# Patient Record
Sex: Male | Born: 1960 | Race: White | Hispanic: No | Marital: Married | Smoking: Never smoker
Health system: Southern US, Community
[De-identification: ages and names within clinical notes are randomized; demographics above are authoritative.]

## PROBLEM LIST (undated history)

## (undated) DIAGNOSIS — E119 Type 2 diabetes mellitus without complications: Secondary | ICD-10-CM

## (undated) DIAGNOSIS — I10 Essential (primary) hypertension: Secondary | ICD-10-CM

## (undated) HISTORY — PX: LEG SURGERY: SHX1003

## (undated) HISTORY — PX: HAND SURGERY: SHX662

## (undated) HISTORY — PX: CERVICAL SPINE SURGERY: SHX589

---

## 2013-05-12 ENCOUNTER — Emergency Department (HOSPITAL_COMMUNITY): Payer: Medicare Other

## 2013-05-12 ENCOUNTER — Encounter (HOSPITAL_COMMUNITY): Payer: Self-pay | Admitting: Emergency Medicine

## 2013-05-12 ENCOUNTER — Inpatient Hospital Stay (HOSPITAL_COMMUNITY)
Admission: EM | Admit: 2013-05-12 | Discharge: 2013-05-16 | DRG: 391 | Disposition: A | Discharge status: Home / Self Care | Payer: Medicare Other | Attending: Neurosurgery | Admitting: Neurosurgery

## 2013-05-12 ENCOUNTER — Other Ambulatory Visit: Payer: Self-pay

## 2013-05-12 DIAGNOSIS — J189 Pneumonia, unspecified organism: Secondary | ICD-10-CM

## 2013-05-12 DIAGNOSIS — G8918 Other acute postprocedural pain: Secondary | ICD-10-CM

## 2013-05-12 DIAGNOSIS — R1313 Dysphagia, pharyngeal phase: Principal | ICD-10-CM | POA: Diagnosis present

## 2013-05-12 DIAGNOSIS — R1310 Dysphagia, unspecified: Secondary | ICD-10-CM

## 2013-05-12 DIAGNOSIS — Z6830 Body mass index (BMI) 30.0-30.9, adult: Secondary | ICD-10-CM

## 2013-05-12 DIAGNOSIS — E119 Type 2 diabetes mellitus without complications: Secondary | ICD-10-CM | POA: Diagnosis present

## 2013-05-12 DIAGNOSIS — I10 Essential (primary) hypertension: Secondary | ICD-10-CM

## 2013-05-12 DIAGNOSIS — A419 Sepsis, unspecified organism: Secondary | ICD-10-CM

## 2013-05-12 DIAGNOSIS — Z981 Arthrodesis status: Secondary | ICD-10-CM

## 2013-05-12 DIAGNOSIS — G4730 Sleep apnea, unspecified: Secondary | ICD-10-CM | POA: Diagnosis present

## 2013-05-12 DIAGNOSIS — E43 Unspecified severe protein-calorie malnutrition: Secondary | ICD-10-CM | POA: Diagnosis present

## 2013-05-12 HISTORY — DX: Type 2 diabetes mellitus without complications: E11.9

## 2013-05-12 HISTORY — DX: Essential (primary) hypertension: I10

## 2013-05-12 LAB — CBC WITH DIFFERENTIAL/PLATELET
Basophils Absolute: 0 10*3/uL (ref 0.0–0.1)
Basophils Relative: 0 % (ref 0–1)
EOS ABS: 0 10*3/uL (ref 0.0–0.7)
EOS PCT: 0 % (ref 0–5)
HCT: 40.9 % (ref 39.0–52.0)
Hemoglobin: 14.3 g/dL (ref 13.0–17.0)
LYMPHS PCT: 15 % (ref 12–46)
Lymphs Abs: 2.7 10*3/uL (ref 0.7–4.0)
MCH: 32.6 pg (ref 26.0–34.0)
MCHC: 35 g/dL (ref 30.0–36.0)
MCV: 93.2 fL (ref 78.0–100.0)
MONO ABS: 1.3 10*3/uL — AB (ref 0.1–1.0)
MONOS PCT: 7 % (ref 3–12)
Neutro Abs: 13.5 10*3/uL — ABNORMAL HIGH (ref 1.7–7.7)
Neutrophils Relative %: 77 % (ref 43–77)
PLATELETS: 241 10*3/uL (ref 150–400)
RBC: 4.39 MIL/uL (ref 4.22–5.81)
RDW: 12.1 % (ref 11.5–15.5)
WBC: 17.5 10*3/uL — ABNORMAL HIGH (ref 4.0–10.5)

## 2013-05-12 LAB — I-STAT CHEM 8, ED
BUN: 14 mg/dL (ref 6–23)
CREATININE: 0.8 mg/dL (ref 0.50–1.35)
Calcium, Ion: 1.06 mmol/L — ABNORMAL LOW (ref 1.12–1.23)
Chloride: 99 mEq/L (ref 96–112)
Glucose, Bld: 188 mg/dL — ABNORMAL HIGH (ref 70–99)
HEMATOCRIT: 40 % (ref 39.0–52.0)
Hemoglobin: 13.6 g/dL (ref 13.0–17.0)
Potassium: 3.9 mEq/L (ref 3.7–5.3)
Sodium: 137 mEq/L (ref 137–147)
TCO2: 24 mmol/L (ref 0–100)

## 2013-05-12 LAB — COMPREHENSIVE METABOLIC PANEL
ALT: 22 U/L (ref 0–53)
AST: 15 U/L (ref 0–37)
Albumin: 3.4 g/dL — ABNORMAL LOW (ref 3.5–5.2)
Alkaline Phosphatase: 91 U/L (ref 39–117)
BUN: 15 mg/dL (ref 6–23)
CO2: 24 meq/L (ref 19–32)
CREATININE: 0.66 mg/dL (ref 0.50–1.35)
Calcium: 9 mg/dL (ref 8.4–10.5)
Chloride: 97 mEq/L (ref 96–112)
GLUCOSE: 192 mg/dL — AB (ref 70–99)
Potassium: 4.3 mEq/L (ref 3.7–5.3)
SODIUM: 137 meq/L (ref 137–147)
TOTAL PROTEIN: 7.6 g/dL (ref 6.0–8.3)
Total Bilirubin: 0.8 mg/dL (ref 0.3–1.2)

## 2013-05-12 LAB — I-STAT TROPONIN, ED: Troponin i, poc: 0 ng/mL (ref 0.00–0.08)

## 2013-05-12 LAB — I-STAT CG4 LACTIC ACID, ED
LACTIC ACID, VENOUS: 1.37 mmol/L (ref 0.5–2.2)
Lactic Acid, Venous: 1.56 mmol/L (ref 0.5–2.2)

## 2013-05-12 MED ORDER — SODIUM CHLORIDE 0.9 % IV SOLN
1000.0000 mL | INTRAVENOUS | Status: DC
  Administered 2013-05-12: 1000 mL via INTRAVENOUS

## 2013-05-12 MED ORDER — ONDANSETRON HCL 4 MG/2ML IJ SOLN
4.0000 mg | Freq: Once | INTRAMUSCULAR | Status: AC
  Administered 2013-05-12: 4 mg via INTRAVENOUS
  Filled 2013-05-12: qty 2

## 2013-05-12 MED ORDER — SODIUM CHLORIDE 0.9 % IV SOLN
1000.0000 mL | Freq: Once | INTRAVENOUS | Status: AC
  Administered 2013-05-12: 1000 mL via INTRAVENOUS

## 2013-05-12 MED ORDER — HYDROMORPHONE HCL PF 1 MG/ML IJ SOLN
0.5000 mg | Freq: Once | INTRAMUSCULAR | Status: AC
  Administered 2013-05-12: 0.5 mg via INTRAVENOUS
  Filled 2013-05-12: qty 1

## 2013-05-12 MED ORDER — ACETAMINOPHEN 650 MG RE SUPP
650.0000 mg | RECTAL | Status: DC | PRN
  Administered 2013-05-12: 650 mg via RECTAL
  Filled 2013-05-12: qty 1

## 2013-05-12 MED ORDER — ACETAMINOPHEN 160 MG/5ML PO SOLN
650.0000 mg | Freq: Once | ORAL | Status: DC
  Filled 2013-05-12: qty 20.3

## 2013-05-12 MED ORDER — PANTOPRAZOLE SODIUM 40 MG IV SOLR
40.0000 mg | Freq: Two times a day (BID) | INTRAVENOUS | Status: DC
  Administered 2013-05-13: 40 mg via INTRAVENOUS
  Filled 2013-05-12 (×3): qty 40

## 2013-05-12 MED ORDER — PIPERACILLIN-TAZOBACTAM 3.375 G IVPB 30 MIN
3.3750 g | Freq: Once | INTRAVENOUS | Status: AC
  Administered 2013-05-12: 3.375 g via INTRAVENOUS
  Filled 2013-05-12: qty 50

## 2013-05-12 MED ORDER — MORPHINE SULFATE 4 MG/ML IJ SOLN
4.0000 mg | Freq: Once | INTRAMUSCULAR | Status: DC
  Filled 2013-05-12: qty 1

## 2013-05-12 MED ORDER — VANCOMYCIN HCL IN DEXTROSE 1-5 GM/200ML-% IV SOLN
1000.0000 mg | Freq: Once | INTRAVENOUS | Status: AC
  Administered 2013-05-13: 1000 mg via INTRAVENOUS
  Filled 2013-05-12: qty 200

## 2013-05-12 MED ORDER — DEXAMETHASONE SODIUM PHOSPHATE 10 MG/ML IJ SOLN
10.0000 mg | Freq: Four times a day (QID) | INTRAMUSCULAR | Status: DC
  Administered 2013-05-13 – 2013-05-16 (×14): 10 mg via INTRAVENOUS
  Filled 2013-05-12 (×22): qty 1

## 2013-05-12 MED ORDER — IOHEXOL 300 MG/ML  SOLN
75.0000 mL | Freq: Once | INTRAMUSCULAR | Status: AC | PRN
  Administered 2013-05-13: 75 mL via INTRAVENOUS

## 2013-05-12 NOTE — ED Provider Notes (Signed)
CSN: [id number]     Arrival date & time 05/12/13  2156 History   First MD Initiated Contact with Patient 05/12/13 2225     Chief Complaint  Patient presents with  . Fever  . Dehydration  . Dizziness  . Nausea  . Emesis     (Consider location/radiation/quality/duration/timing/severity/associated sxs/prior Treatment) HPI Comments: 53 year old male, history of recent anterior approach cervical spine surgery for a ruptured disc who presents with a fever and sore throat with difficulty swallowing. This was gradual in onset, persistent, gradually worsening and has now become severe. He has associated nausea, he is unable to swallow even liquids. This is rapidly progressive. He is not on antibiotics. The patient is known to be a diabetic and had some hypertension.  Spell states that he has had some altered mental status today. He has been very dizzy, he has had some visual complications getting lightheaded when he stands up, he has had some confusion as well.  Patient is a 53 y.o. male presenting with fever, dizziness, and vomiting. The history is provided by the patient.  Fever Associated symptoms: vomiting   Dizziness Associated symptoms: vomiting   Emesis   Past Medical History  Diagnosis Date  . Diabetes mellitus without complication   . Hypertension    Past Surgical History  Procedure Laterality Date  . Cervical spine surgery    . Hand surgery     No family history on file. History  Substance Use Topics  . Smoking status: Never Smoker   . Smokeless tobacco: Not on file  . Alcohol Use: Yes    Review of Systems  Constitutional: Positive for fever.  Gastrointestinal: Positive for vomiting.  Neurological: Positive for dizziness.  All other systems reviewed and are negative.      Allergies  Sulfa antibiotics  Home Medications   Current Outpatient Rx  Name  Route  Sig  Dispense  Refill  . aspirin EC 81 MG tablet   Oral   Take 81 mg by mouth daily.         Marland Kitchen.  atorvastatin (LIPITOR) 20 MG tablet   Oral   Take 20 mg by mouth daily.         . diazepam (VALIUM) 5 MG tablet   Oral   Take 5 mg by mouth every 6 (six) hours as needed for anxiety or muscle spasms.          Marland Kitchen. HYDROcodone-acetaminophen (NORCO/VICODIN) 5-325 MG per tablet   Oral   Take 1 tablet by mouth every 6 (six) hours as needed for moderate pain.         Marland Kitchen. insulin glargine (LANTUS) 100 UNIT/ML injection   Subcutaneous   Inject 70 Units into the skin every morning.         . insulin lispro (HUMALOG) 100 UNIT/ML injection   Subcutaneous   Inject 18 Units into the skin 3 (three) times daily before meals.         Marland Kitchen. lisinopril (PRINIVIL,ZESTRIL) 5 MG tablet   Oral   Take 5 mg by mouth daily.         . Multiple Vitamins-Minerals (MULTIVITAMIN PO)   Oral   Take 1 tablet by mouth daily.         Marland Kitchen. oxyCODONE (ROXICODONE) 15 MG immediate release tablet   Oral   Take 15 mg by mouth every 4 (four) hours as needed for pain.         . pregabalin (LYRICA) 75 MG capsule  Oral   Take 75 mg by mouth daily.          BP 115/62  Pulse 111  Temp(Src) 102.1 F (38.9 C) (Oral)  Resp 26  Ht 6' (1.829 m)  Wt 226 lb (102.513 kg)  BMI 30.64 kg/m2  SpO2 91% Physical Exam  Nursing note and vitals reviewed. Constitutional: He appears well-developed and well-nourished. He appears distressed.  HENT:  Head: Normocephalic and atraumatic.  Mouth/Throat: No oropharyngeal exudate.  Purulent exudative material on the bilateral tonsils and posterior soft palate. Oropharynx is patent, phonation is slightly hoarse but there is no difficulty in no distress with breathing  Eyes: Conjunctivae and EOM are normal. Pupils are equal, round, and reactive to light. Right eye exhibits no discharge. Left eye exhibits no discharge. No scleral icterus.  Neck:  Significant swelling and erythema around the surgical site, the left side of the anterior neck is swollen and tender.  Cardiovascular:  Regular rhythm, normal heart sounds and intact distal pulses.  Exam reveals no gallop and no friction rub.   No murmur heard. Tachycardia present  Pulmonary/Chest: Effort normal and breath sounds normal. No respiratory distress. He has no wheezes. He has no rales.  Abdominal: Soft. Bowel sounds are normal. He exhibits no distension and no mass. There is no tenderness.  Musculoskeletal: Normal range of motion. He exhibits no edema and no tenderness.  Neurological: He is alert. Coordination normal.  Answers questions appropriately, follows all commands, normal strength of all 4 extremities. Cranial nerves III through XII intact  Skin: Skin is warm and dry. No rash noted. No erythema.  Psychiatric: He has a normal mood and affect. His behavior is normal.    ED Course  Procedures (including critical care time) Labs Review Labs Reviewed  CBC WITH DIFFERENTIAL - Abnormal; Notable for the following:    WBC 17.5 (*)    Neutro Abs 13.5 (*)    Monocytes Absolute 1.3 (*)    All other components within normal limits  COMPREHENSIVE METABOLIC PANEL - Abnormal; Notable for the following:    Glucose, Bld 192 (*)    Albumin 3.4 (*)    All other components within normal limits  I-STAT CHEM 8, ED - Abnormal; Notable for the following:    Glucose, Bld 188 (*)    Calcium, Ion 1.06 (*)    All other components within normal limits  CULTURE, BLOOD (ROUTINE X 2)  CULTURE, BLOOD (ROUTINE X 2)  I-STAT CG4 LACTIC ACID, ED  I-STAT TROPOININ, ED  I-STAT CG4 LACTIC ACID, ED   Imaging Review Dg Chest Port 1 View  05/12/2013   CLINICAL DATA:  Shortness of breath.  EXAM: PORTABLE CHEST - 1 VIEW  COMPARISON:  None.  FINDINGS: Mild cardiomegaly is noted. Status post surgical fusion of lower cervical spine. Bilateral basilar opacities are noted concerning for pneumonia or subsegmental atelectasis. No pleural effusion or pneumothorax is noted.  IMPRESSION: Mild bilateral basilar opacities concerning for pneumonia or  subsegmental atelectasis.   Electronically Signed   By: Roque Lias M.D.   On: 05/12/2013 23:56      MDM   Final diagnoses:  Sepsis  HCAP (healthcare-associated pneumonia)  Post-operative pain    The patient appears acutely ill and is likely septic from a postoperative infection of the neck. I am somewhat concerned that this infection may spread to the mediastinum down into the chest, he will need imaging of the cervical spine, soft tissues of the neck and CT scan of the chest  with contrast to evaluate for spreading infection. Broad spectrum antibiotics after blood cultures, anticipate admission to the hospital. His airway at this time is open but will keep a close eye as he may need definitive airway management should his breathing become more labored or any upper airway obstruction.  D/w Dr. Wynetta Emery at 11:15 - aware of pt - will d/w critical care  Discussed with neurosurgeon who has written admission orders. The patient has a fever, tachycardia, leukocytosis and has mild hypoxia with evidence of pneumonia on x-ray which is mild but present. CT scans of the neck show no signs of major infection, hematoma, abscess or tracheal compromise. Because of the risk of airway management need he will go to the intensive. Tonight. The patient is septic from infection, here does appear critically ill but improving.  CRITICAL CARE Performed by: Vida Roller Total critical care time: 35 Critical care time was exclusive of separately billable procedures and treating other patients. Critical care was necessary to treat or prevent imminent or life-threatening deterioration. Critical care was time spent personally by me on the following activities: development of treatment plan with patient and/or surrogate as well as nursing, discussions with consultants, evaluation of patient's response to treatment, examination of patient, obtaining history from patient or surrogate, ordering and performing treatments and  interventions, ordering and review of laboratory studies, ordering and review of radiographic studies, pulse oximetry and re-evaluation of patient's condition.   Vida Roller, MD 05/13/13 [phone]

## 2013-05-12 NOTE — ED Notes (Signed)
Family at bedside. 

## 2013-05-12 NOTE — Consult Note (Signed)
Reason for Consult: Neck pain and swallowing difficulty Referring Physician: Emergency department  Gerald Ross is an 53 y.o. male.  HPI: Patient is a 53 year old gentleman who is now 2 days status post anterior cervical discectomy and fusion performed by Dr. Joya Salm has been experiencing increasing levels of dysphagia at home as well as poor appetite poor by mouth intake. Patient denies any arm pain or numbness and tingling in his hands or his legs he denies any difficulty breathing just reports a lot of difficulty swallowing feels like things are catching in his throat and increased sputum production. They have noticed the some swelling around the neck but they said it was a lot worse yesterday than it is today.  Past Medical History  Diagnosis Date  . Diabetes mellitus without complication   . Hypertension     Past Surgical History  Procedure Laterality Date  . Cervical spine surgery    . Hand surgery      No family history on file.  Social History:  reports that he has never smoked. He does not have any smokeless tobacco history on file. He reports that he drinks alcohol. He reports that he does not use illicit drugs.  Allergies:  Allergies  Allergen Reactions  . Sulfa Antibiotics Nausea And Vomiting    Medications: I have reviewed the patient's current medications.  Results for orders placed during the hospital encounter of 05/12/13 (from the past 48 hour(s))  CBC WITH DIFFERENTIAL     Status: Abnormal   Collection Time    05/12/13 10:20 PM      Result Value Ref Range   WBC 17.5 (*) 4.0 - 10.5 K/uL   RBC 4.39  4.22 - 5.81 MIL/uL   Hemoglobin 14.3  13.0 - 17.0 g/dL   HCT 40.9  39.0 - 52.0 %   MCV 93.2  78.0 - 100.0 fL   MCH 32.6  26.0 - 34.0 pg   MCHC 35.0  30.0 - 36.0 g/dL   RDW 12.1  11.5 - 15.5 %   Platelets 241  150 - 400 K/uL   Neutrophils Relative % 77  43 - 77 %   Neutro Abs 13.5 (*) 1.7 - 7.7 K/uL   Lymphocytes Relative 15  12 - 46 %   Lymphs Abs 2.7  0.7 -  4.0 K/uL   Monocytes Relative 7  3 - 12 %   Monocytes Absolute 1.3 (*) 0.1 - 1.0 K/uL   Eosinophils Relative 0  0 - 5 %   Eosinophils Absolute 0.0  0.0 - 0.7 K/uL   Basophils Relative 0  0 - 1 %   Basophils Absolute 0.0  0.0 - 0.1 K/uL  COMPREHENSIVE METABOLIC PANEL     Status: Abnormal   Collection Time    05/12/13 10:20 PM      Result Value Ref Range   Sodium 137  137 - 147 mEq/L   Potassium 4.3  3.7 - 5.3 mEq/L   Chloride 97  96 - 112 mEq/L   CO2 24  19 - 32 mEq/L   Glucose, Bld 192 (*) 70 - 99 mg/dL   BUN 15  6 - 23 mg/dL   Creatinine, Ser 0.66  0.50 - 1.35 mg/dL   Calcium 9.0  8.4 - 10.5 mg/dL   Total Protein 7.6  6.0 - 8.3 g/dL   Albumin 3.4 (*) 3.5 - 5.2 g/dL   AST 15  0 - 37 U/L   ALT 22  0 - 53 U/L  Alkaline Phosphatase 91  39 - 117 U/L   Total Bilirubin 0.8  0.3 - 1.2 mg/dL   GFR calc non Af Amer >90  >90 mL/min   GFR calc Af Amer >90  >90 mL/min   Comment: (NOTE)     The eGFR has been calculated using the CKD EPI equation.     This calculation has not been validated in all clinical situations.     eGFR's persistently <90 mL/min signify possible Chronic Kidney     Disease.  Randolm Idol, ED     Status: None   Collection Time    05/12/13 10:31 PM      Result Value Ref Range   Troponin i, poc 0.00  0.00 - 0.08 ng/mL   Comment 3            Comment: Due to the release kinetics of cTnI,     a negative result within the first hours     of the onset of symptoms does not rule out     myocardial infarction with certainty.     If myocardial infarction is still suspected,     repeat the test at appropriate intervals.  I-STAT CG4 LACTIC ACID, ED     Status: None   Collection Time    05/12/13 10:33 PM      Result Value Ref Range   Lactic Acid, Venous 1.56  0.5 - 2.2 mmol/L  I-STAT CHEM 8, ED     Status: Abnormal   Collection Time    05/12/13 11:37 PM      Result Value Ref Range   Sodium 137  137 - 147 mEq/L   Potassium 3.9  3.7 - 5.3 mEq/L   Chloride 99  96 -  112 mEq/L   BUN 14  6 - 23 mg/dL   Creatinine, Ser 0.80  0.50 - 1.35 mg/dL   Glucose, Bld 188 (*) 70 - 99 mg/dL   Calcium, Ion 1.06 (*) 1.12 - 1.23 mmol/L   TCO2 24  0 - 100 mmol/L   Hemoglobin 13.6  13.0 - 17.0 g/dL   HCT 40.0  39.0 - 52.0 %  I-STAT CG4 LACTIC ACID, ED     Status: None   Collection Time    05/12/13 11:38 PM      Result Value Ref Range   Lactic Acid, Venous 1.37  0.5 - 2.2 mmol/L    No results found.  Review of Systems  Constitutional: Negative.   HENT: Positive for congestion and sore throat.   Respiratory: Positive for sputum production.   Musculoskeletal: Positive for neck pain.  Skin: Negative.    Blood pressure 137/70, pulse 133, temperature 102.1 F (38.9 C), temperature source Oral, resp. rate 18, height 6' (1.829 m), weight 102.513 kg (226 lb), SpO2 95.00%. Physical Exam  Constitutional: He is oriented to person, place, and time. He appears well-developed and well-nourished.  HENT:  Head: Normocephalic.  Eyes: Pupils are equal, round, and reactive to light.  Respiratory: Effort normal.  GI: Soft.  Neurological: He is alert and oriented to person, place, and time. GCS eye subscore is 4. GCS verbal subscore is 5. GCS motor subscore is 6.  Reflex Scores:      Tricep reflexes are 2+ on the right side and 2+ on the left side.      Bicep reflexes are 2+ on the right side and 2+ on the left side.      Brachioradialis reflexes are 2+ on the right side  and 2+ on the left side.      Patellar reflexes are 2+ on the right side and 2+ on the left side.      Achilles reflexes are 2+ on the right side and 2+ on the left side. Patient appears to be awake alert in no acute distress .Strength is 5 out of 5 in his upper and lower extremities. Neck is mildly swollen nontender except to deep pressure trachea appears to be midline no significant amount of erythema or drainage  Skin: Skin is warm and dry.    Assessment/Plan: 53 year old gentleman presents with dysphagia  today status post anterior cervical discectomy fusion with a mild to moderate amount of swelling in his neck. He does not appear to have a large definite hematoma. However with his degree of dysphagia we are proceeding 4 with a soft tissue CT of his neck. Should this be negative for compressive hematoma will admit the patient on IV steroids watching his sugars very carefully and treat the dysphagia. If the CT scan is positive for hematoma will consider taken to the OR for evacuation.  Saron Tweed P 05/12/2013, 11:53 PM

## 2013-05-12 NOTE — ED Notes (Signed)
C/o post op complications. C/o fever, nv, dizziness, decreased urinary ouput. H/o DM. c5-6 surgery done Friday morning. Sx since day of surgery (Dr. Jeral FruitBotero). Last emesis upon arrival. No meds taken PTA. Last pain med at 1340. Minimal redness and swelling to anterior neck incision. Swelling has improved per spouse.

## 2013-05-13 ENCOUNTER — Inpatient Hospital Stay (HOSPITAL_COMMUNITY): Payer: Medicare Other

## 2013-05-13 ENCOUNTER — Encounter (HOSPITAL_COMMUNITY)
Admission: EM | Disposition: A | Discharge status: Home / Self Care | Payer: Self-pay | Source: Home / Self Care | Attending: Neurosurgery

## 2013-05-13 ENCOUNTER — Encounter (HOSPITAL_COMMUNITY): Payer: Self-pay | Admitting: Certified Registered Nurse Anesthetist

## 2013-05-13 ENCOUNTER — Encounter (HOSPITAL_COMMUNITY): Payer: Self-pay | Admitting: *Deleted

## 2013-05-13 DIAGNOSIS — E43 Unspecified severe protein-calorie malnutrition: Secondary | ICD-10-CM | POA: Insufficient documentation

## 2013-05-13 DIAGNOSIS — I10 Essential (primary) hypertension: Secondary | ICD-10-CM

## 2013-05-13 DIAGNOSIS — E119 Type 2 diabetes mellitus without complications: Secondary | ICD-10-CM

## 2013-05-13 DIAGNOSIS — G8918 Other acute postprocedural pain: Secondary | ICD-10-CM

## 2013-05-13 DIAGNOSIS — R1310 Dysphagia, unspecified: Secondary | ICD-10-CM

## 2013-05-13 LAB — MRSA PCR SCREENING: MRSA by PCR: NEGATIVE

## 2013-05-13 LAB — GLUCOSE, CAPILLARY
Glucose-Capillary: 153 mg/dL — ABNORMAL HIGH (ref 70–99)
Glucose-Capillary: 252 mg/dL — ABNORMAL HIGH (ref 70–99)
Glucose-Capillary: 228 mg/dL — ABNORMAL HIGH (ref 70–99)
Glucose-Capillary: 292 mg/dL — ABNORMAL HIGH (ref 70–99)
Glucose-Capillary: 247 mg/dL — ABNORMAL HIGH (ref 70–99)

## 2013-05-13 LAB — CBG MONITORING, ED: GLUCOSE-CAPILLARY: 159 mg/dL — AB (ref 70–99)

## 2013-05-13 LAB — PROCALCITONIN: PROCALCITONIN: 0.14 ng/mL

## 2013-05-13 SURGERY — ANTERIOR CERVICAL DECOMPRESSION/DISCECTOMY FUSION 1 LEVEL
Anesthesia: General

## 2013-05-13 MED ORDER — DOCUSATE SODIUM 100 MG PO CAPS
100.0000 mg | ORAL_CAPSULE | Freq: Two times a day (BID) | ORAL | Status: DC
  Administered 2013-05-14 – 2013-05-15 (×2): 100 mg via ORAL
  Filled 2013-05-13 (×8): qty 1

## 2013-05-13 MED ORDER — PREGABALIN 50 MG PO CAPS
75.0000 mg | ORAL_CAPSULE | Freq: Every day | ORAL | Status: DC
  Administered 2013-05-14 – 2013-05-15 (×2): 75 mg via ORAL
  Filled 2013-05-13 (×4): qty 1

## 2013-05-13 MED ORDER — CYCLOBENZAPRINE HCL 10 MG PO TABS
10.0000 mg | ORAL_TABLET | Freq: Three times a day (TID) | ORAL | Status: DC | PRN
  Filled 2013-05-13: qty 1

## 2013-05-13 MED ORDER — POTASSIUM CHLORIDE IN NACL 20-0.9 MEQ/L-% IV SOLN
INTRAVENOUS | Status: DC
  Filled 2013-05-13 (×6): qty 1000

## 2013-05-13 MED ORDER — HYDROMORPHONE HCL PF 1 MG/ML IJ SOLN
0.5000 mg | INTRAMUSCULAR | Status: DC | PRN
  Administered 2013-05-13 (×2): 1 mg via INTRAVENOUS
  Filled 2013-05-13 (×2): qty 1

## 2013-05-13 MED ORDER — INSULIN ASPART 100 UNIT/ML ~~LOC~~ SOLN
0.0000 [IU] | Freq: Three times a day (TID) | SUBCUTANEOUS | Status: DC
  Administered 2013-05-13: 8 [IU] via SUBCUTANEOUS
  Administered 2013-05-13: 5 [IU] via SUBCUTANEOUS
  Administered 2013-05-13 – 2013-05-14 (×2): 8 [IU] via SUBCUTANEOUS
  Administered 2013-05-14: 11 [IU] via SUBCUTANEOUS
  Administered 2013-05-14: 8 [IU] via SUBCUTANEOUS
  Administered 2013-05-15: 2 [IU] via SUBCUTANEOUS
  Administered 2013-05-15 (×2): 8 [IU] via SUBCUTANEOUS
  Administered 2013-05-16: 3 [IU] via SUBCUTANEOUS

## 2013-05-13 MED ORDER — INSULIN GLARGINE 100 UNIT/ML ~~LOC~~ SOLN
35.0000 [IU] | Freq: Every day | SUBCUTANEOUS | Status: DC
  Filled 2013-05-13: qty 0.35

## 2013-05-13 MED ORDER — INSULIN ASPART 100 UNIT/ML ~~LOC~~ SOLN
0.0000 [IU] | Freq: Every day | SUBCUTANEOUS | Status: DC
  Administered 2013-05-13 – 2013-05-14 (×2): 2 [IU] via SUBCUTANEOUS

## 2013-05-13 MED ORDER — PHENOL 1.4 % MT LIQD
1.0000 | OROMUCOSAL | Status: DC | PRN
  Filled 2013-05-13: qty 177

## 2013-05-13 MED ORDER — OXYCODONE-ACETAMINOPHEN 5-325 MG PO TABS
1.0000 | ORAL_TABLET | ORAL | Status: DC | PRN
  Administered 2013-05-13: 2 via ORAL
  Filled 2013-05-13: qty 2

## 2013-05-13 MED ORDER — PIPERACILLIN-TAZOBACTAM 3.375 G IVPB
3.3750 g | Freq: Three times a day (TID) | INTRAVENOUS | Status: DC
  Administered 2013-05-13: 3.375 g via INTRAVENOUS
  Filled 2013-05-13 (×3): qty 50

## 2013-05-13 MED ORDER — ACETAMINOPHEN 325 MG PO TABS: 650.0000 mg | ORAL_TABLET | ORAL | Status: DC | PRN

## 2013-05-13 MED ORDER — PROPOFOL 10 MG/ML IV BOLUS
INTRAVENOUS | Status: AC
  Filled 2013-05-13: qty 20

## 2013-05-13 MED ORDER — SODIUM CHLORIDE 0.9 % IJ SOLN: 3.0000 mL | INTRAMUSCULAR | Status: DC | PRN

## 2013-05-13 MED ORDER — ONDANSETRON HCL 4 MG/2ML IJ SOLN: 4.0000 mg | INTRAMUSCULAR | Status: DC | PRN

## 2013-05-13 MED ORDER — SODIUM CHLORIDE 0.9 % IJ SOLN
3.0000 mL | Freq: Two times a day (BID) | INTRAMUSCULAR | Status: DC
  Administered 2013-05-13 – 2013-05-15 (×4): 3 mL via INTRAVENOUS

## 2013-05-13 MED ORDER — MENTHOL 3 MG MT LOZG
1.0000 | LOZENGE | OROMUCOSAL | Status: DC | PRN
Start: 2013-05-12 — End: 2013-05-16

## 2013-05-13 MED ORDER — VANCOMYCIN HCL IN DEXTROSE 1-5 GM/200ML-% IV SOLN
1000.0000 mg | Freq: Three times a day (TID) | INTRAVENOUS | Status: DC
  Administered 2013-05-13: 1000 mg via INTRAVENOUS
  Filled 2013-05-13 (×3): qty 200

## 2013-05-13 MED ORDER — INSULIN GLARGINE 100 UNIT/ML ~~LOC~~ SOLN
52.0000 [IU] | Freq: Every day | SUBCUTANEOUS | Status: DC
  Administered 2013-05-13 – 2013-05-14 (×2): 52 [IU] via SUBCUTANEOUS
  Filled 2013-05-13 (×2): qty 0.52

## 2013-05-13 MED ORDER — MIDAZOLAM HCL 2 MG/2ML IJ SOLN
INTRAMUSCULAR | Status: AC
  Filled 2013-05-13: qty 2

## 2013-05-13 MED ORDER — DEXTROSE-NACL 5-0.45 % IV SOLN
INTRAVENOUS | Status: DC
  Administered 2013-05-13: 04:00:00 via INTRAVENOUS

## 2013-05-13 MED ORDER — SODIUM CHLORIDE 0.9 % IV SOLN: 250.0000 mL | INTRAVENOUS | Status: DC

## 2013-05-13 MED ORDER — ACETAMINOPHEN 650 MG RE SUPP: 650.0000 mg | RECTAL | Status: DC | PRN

## 2013-05-13 MED ORDER — FENTANYL CITRATE 0.05 MG/ML IJ SOLN
INTRAMUSCULAR | Status: AC
  Filled 2013-05-13: qty 5

## 2013-05-13 SURGICAL SUPPLY — 42 items
BANDAGE GAUZE ELAST BULKY 4 IN (GAUZE/BANDAGES/DRESSINGS) ×6 IMPLANT
BENZOIN TINCTURE PRP APPL 2/3 (GAUZE/BANDAGES/DRESSINGS) ×3 IMPLANT
BLADE ULTRA TIP 2M (BLADE) ×3 IMPLANT
BUR BARREL STRAIGHT FLUTE 4.0 (BURR) IMPLANT
BUR MATCHSTICK NEURO 3.0 LAGG (BURR) ×3 IMPLANT
CANISTER SUCT 3000ML (MISCELLANEOUS) ×3 IMPLANT
CLOSURE WOUND 1/2 X4 (GAUZE/BANDAGES/DRESSINGS) ×1
CONT SPEC 4OZ CLIKSEAL STRL BL (MISCELLANEOUS) ×3 IMPLANT
COVER MAYO STAND STRL (DRAPES) ×3 IMPLANT
DRAPE C-ARM 42X72 X-RAY (DRAPES) ×6 IMPLANT
DRAPE LAPAROTOMY 100X72 PEDS (DRAPES) ×3 IMPLANT
DRAPE MICROSCOPE LEICA (MISCELLANEOUS) ×3 IMPLANT
DRAPE POUCH INSTRU U-SHP 10X18 (DRAPES) ×3 IMPLANT
DURAPREP 6ML APPLICATOR 50/CS (WOUND CARE) ×3 IMPLANT
ELECT REM PT RETURN 9FT ADLT (ELECTROSURGICAL) ×3
ELECTRODE REM PT RTRN 9FT ADLT (ELECTROSURGICAL) ×1 IMPLANT
GAUZE SPONGE 4X4 16PLY XRAY LF (GAUZE/BANDAGES/DRESSINGS) IMPLANT
GLOVE BIOGEL M 8.0 STRL (GLOVE) ×3 IMPLANT
GLOVE EXAM NITRILE LRG STRL (GLOVE) IMPLANT
GLOVE EXAM NITRILE MD LF STRL (GLOVE) IMPLANT
GLOVE EXAM NITRILE XL STR (GLOVE) IMPLANT
GLOVE EXAM NITRILE XS STR PU (GLOVE) IMPLANT
GOWN BRE IMP SLV AUR LG STRL (GOWN DISPOSABLE) ×3 IMPLANT
GOWN BRE IMP SLV AUR XL STRL (GOWN DISPOSABLE) IMPLANT
GOWN STRL REIN 2XL LVL4 (GOWN DISPOSABLE) IMPLANT
HEMOSTAT POWDER KIT SURGIFOAM (HEMOSTASIS) ×3 IMPLANT
KIT BASIN OR (CUSTOM PROCEDURE TRAY) ×3 IMPLANT
KIT ROOM TURNOVER OR (KITS) ×3 IMPLANT
NEEDLE SPNL 22GX3.5 QUINCKE BK (NEEDLE) ×3 IMPLANT
NS IRRIG 1000ML POUR BTL (IV SOLUTION) ×3 IMPLANT
PACK LAMINECTOMY NEURO (CUSTOM PROCEDURE TRAY) ×3 IMPLANT
PATTIES SURGICAL .5 X1 (DISPOSABLE) ×3 IMPLANT
RUBBERBAND STERILE (MISCELLANEOUS) ×6 IMPLANT
SPONGE GAUZE 4X4 12PLY (GAUZE/BANDAGES/DRESSINGS) ×3 IMPLANT
SPONGE INTESTINAL PEANUT (DISPOSABLE) ×6 IMPLANT
SPONGE SURGIFOAM ABS GEL SZ50 (HEMOSTASIS) ×3 IMPLANT
STRIP CLOSURE SKIN 1/2X4 (GAUZE/BANDAGES/DRESSINGS) ×2 IMPLANT
SUT VIC AB 3-0 SH 8-18 (SUTURE) ×3 IMPLANT
SYR 20ML ECCENTRIC (SYRINGE) ×3 IMPLANT
TOWEL OR 17X24 6PK STRL BLUE (TOWEL DISPOSABLE) ×3 IMPLANT
TOWEL OR 17X26 10 PK STRL BLUE (TOWEL DISPOSABLE) ×3 IMPLANT
WATER STERILE IRR 1000ML POUR (IV SOLUTION) ×3 IMPLANT

## 2013-05-13 NOTE — Progress Notes (Signed)
Patient ID: Gerald Ross, male   DOB: 10/25/1960, 53 y.o.   MRN: [id number] The CT scan appears consistent with normal postoperative appearance with a small approximately 1cm prevertebral fluid collection with mild mass effect on the posterior esophagus and airway. However airway appears to be patent. Patient continues to have no difficulty breathing his primary complaint is dysphagia. Patient will be admitted to the care service to the ICU with critical-care consultant to evaluate his chest x-ray which may show a mild ammonia which would help explain his fever and tachycardia. Patient received IV Zosyn in the ER we'll maintained on IV Decadron we will follow his diabetes while in the ICU

## 2013-05-13 NOTE — ED Notes (Signed)
Patient transported to CT 

## 2013-05-13 NOTE — Progress Notes (Signed)
UR completed. Patient changed to inpatient- requiring IV decadron and hematoma evacuation

## 2013-05-13 NOTE — Consult Note (Signed)
Name: Gerald Ross MRN: [id number] DOB: 11/23/1960    ADMISSION DATE:  05/12/2013 CONSULTATION DATE:  3/8  REFERRING MD :  Mariam DollarGary P Cram, MS PRIMARY SERVICE:  Neurosurgery  CHIEF COMPLAINT:  Dysphagia/odynophagia  BRIEF PATIENT DESCRIPTION: 53 year old male 48 hours post poster anterior cervical discetomy and fusion who presents with 24 hour history of fevers and worsening dysphagia/odynophagia.  SIGNIFICANT EVENTS / STUDIES:  CT Neck Status post surgical anterior fusion of C5-6. Soft tissue gas is seen in the soft tissues of the neck in this area which is an expected postoperative finding. Soft tissue gas is also seen in the prevertebral space which is expected postoperative finding. Fluid collection is seen in the prevertebral soft tissues space above the surgical site which does result and displacement of the hypopharyngeal airway and measures 12 mm in maximum thickness. While this may simply represent postoperative fluid, abscess cannot be excluded. These results were discussed with the referring physician.   LINES / TUBES: none  CULTURES: Blood   ANTIBIOTICS: Vanc/Zosyn  HISTORY OF PRESENT ILLNESS:   53 year old male 48 hours post poster anterior cervical discetomy and fusion who presents with 24 hour history of fevers and worsening dysphagia/odynophagia.  Patient with dizziness today and confusion (lethagy), temp of 101 that increased to >102 in the ED, associated nausea and dry heaves, poor urine output, and worsening odynophagia with post op dysphagia.  Patient with poor appetite.  Denies any other complaints.  Does not complain of rigors, diaphoresis, respiratory distress, abd pain, diarrhea, dysuria, hematuria, epistaxis, hemoptysis, ecchymosis.  Some swelling of his neck, however this has improved since yesterday.  PAST MEDICAL HISTORY :  Past Medical History  Diagnosis Date  . Diabetes mellitus without complication   . Hypertension    Past Surgical History  Procedure  Laterality Date  . Cervical spine surgery    . Hand surgery    . Leg surgery      pins noted in   Prior to Admission medications   Medication Sig Start Date End Date Taking? Authorizing Provider  aspirin EC 81 MG tablet Take 81 mg by mouth daily.   Yes Historical Provider, MD  atorvastatin (LIPITOR) 20 MG tablet Take 20 mg by mouth daily.   Yes Historical Provider, MD  diazepam (VALIUM) 5 MG tablet Take 5 mg by mouth every 6 (six) hours as needed for anxiety or muscle spasms.    Yes Historical Provider, MD  HYDROcodone-acetaminophen (NORCO/VICODIN) 5-325 MG per tablet Take 1 tablet by mouth every 6 (six) hours as needed for moderate pain.   Yes Historical Provider, MD  insulin glargine (LANTUS) 100 UNIT/ML injection Inject 70 Units into the skin every morning.   Yes Historical Provider, MD  insulin lispro (HUMALOG) 100 UNIT/ML injection Inject 18 Units into the skin 3 (three) times daily before meals.   Yes Historical Provider, MD  lisinopril (PRINIVIL,ZESTRIL) 5 MG tablet Take 5 mg by mouth daily.   Yes Historical Provider, MD  Multiple Vitamins-Minerals (MULTIVITAMIN PO) Take 1 tablet by mouth daily.   Yes Historical Provider, MD  oxyCODONE (ROXICODONE) 15 MG immediate release tablet Take 15 mg by mouth every 4 (four) hours as needed for pain.   Yes Historical Provider, MD  pregabalin (LYRICA) 75 MG capsule Take 75 mg by mouth daily.   Yes Historical Provider, MD   Allergies  Allergen Reactions  . Sulfa Antibiotics Nausea And Vomiting    FAMILY HISTORY:  No family history on file. SOCIAL HISTORY:  reports that he has never smoked. He does not have any smokeless tobacco history on file. He reports that he drinks alcohol. He reports that he does not use illicit drugs.  REVIEW OF SYSTEMS:   Constitutional: Negative for fever, chills, weight loss, malaise/fatigue and diaphoresis.  HENT: Negative for hearing loss, ear pain, nosebleeds, congestion, sore throat, neck pain, tinnitus and ear  discharge.   Eyes: Negative for blurred vision, double vision, photophobia, pain, discharge and redness.  Respiratory: Negative for cough, hemoptysis, sputum production, shortness of breath, wheezing and stridor.   Cardiovascular: Negative for chest pain, palpitations, orthopnea, claudication, leg swelling and PND.  Gastrointestinal: Negative for heartburn, nausea, vomiting, abdominal pain, diarrhea, constipation, blood in stool and melena.  Genitourinary: Negative for dysuria, urgency, frequency, hematuria and flank pain.  Musculoskeletal: Negative for myalgias, back pain, joint pain and falls.  Skin: Negative for itching and rash.  Neurological: Negative for dizziness, tingling, tremors, sensory change, speech change, focal weakness, seizures, loss of consciousness, weakness and headaches.  Endo/Heme/Allergies: Negative for environmental allergies and polydipsia. Does not bruise/bleed easily.  SUBJECTIVE:   VITAL SIGNS: Temp:  [102.1 F (38.9 C)] 102.1 F (38.9 C) (03/08 2202) Pulse Rate:  [101-133] 101 (03/09 0145) Resp:  [18-26] 22 (03/09 0145) BP: (115-137)/(53-75) 118/63 mmHg (03/09 0145) SpO2:  [91 %-97 %] 97 % (03/09 0145) Weight:  [226 lb (102.513 kg)] 226 lb (102.513 kg) (03/08 2202)  PHYSICAL EXAMINATION: General:  NAD, afebrile, VSS, anicteric, non toxic appearing Neuro:  Moves all extremities, full strength, no gross CN deficit, AAOx4 HEENT:  Mild erythema, mild swelling on neck bilaterally, trachea midline, no drainage from wound site, clean dry surgical site Cardiovascular:  RRR, No murmur rubs or gallops, normal S1S2 Lungs:  Clear to auscultation, no respiratory distress, no rales/rhonchi/or wheezes noted, good air movement bilaterally  Abdomen:  Non tender, non distended, normal bowel sounds, no hepatomegaly Skin:  Clammy, but non diaphoretic, pink, warm, without rashes   Recent Labs Lab 05/12/13 2220 05/12/13 2337  NA 137 137  K 4.3 3.9  CL 97 99  CO2 24  --    BUN 15 14  CREATININE 0.66 0.80  GLUCOSE 192* 188*    Recent Labs Lab 05/12/13 2220 05/12/13 2337  HGB 14.3 13.6  HCT 40.9 40.0  WBC 17.5*  --   PLT 241  --    Ct Soft Tissue Neck W Contrast  05/13/2013   CLINICAL DATA:  Dysphagia, fever status post cervical discectomy and fusion.  EXAM: CT NECK WITH CONTRAST  TECHNIQUE: Multidetector CT imaging of the neck was performed using the standard protocol following the bolus administration of intravenous contrast.  CONTRAST:  75mL OMNIPAQUE IOHEXOL 300 MG/ML  SOLN  COMPARISON:  None.  FINDINGS: Status post surgical anterior fusion of C5-6. Intervening spacer device is noted. Visualized portions of upper lung fields appear normal. Epiglottis appears normal. Parotid glands appear normal. No significant adenopathy is noted soft tissue gas is seen in the anterior portion the neck as well as in the prevertebral space consistent with the patient's postoperative status. There is seen fluid in the prevertebral space that demonstrates in maximum thickness of approximately 12 mm. This may simply represent postoperative fluid, but the possibility of retropharyngeal abscess cannot be excluded. This does cause some displacement of the hypopharyngeal airway. Vascular structures appear to be intact the visualized portion of the trachea appears normal. Thyroid gland appears normal. Visualized portions of paranasal sinuses appear normal.  IMPRESSION: Status post surgical anterior fusion  of C5-6. Soft tissue gas is seen in the soft tissues of the neck in this area which is an expected postoperative finding. Soft tissue gas is also seen in the prevertebral space which is expected postoperative finding. Fluid collection is seen in the prevertebral soft tissues space above the surgical site which does result and displacement of the hypopharyngeal airway and measures 12 mm in maximum thickness. While this may simply represent postoperative fluid, abscess cannot be excluded.  These results were discussed with the referring physician.   Electronically Signed   By: Roque Lias M.D.   On: 05/13/2013 00:53   Dg Chest Port 1 View  05/12/2013   CLINICAL DATA:  Shortness of breath.  EXAM: PORTABLE CHEST - 1 VIEW  COMPARISON:  None.  FINDINGS: Mild cardiomegaly is noted. Status post surgical fusion of lower cervical spine. Bilateral basilar opacities are noted concerning for pneumonia or subsegmental atelectasis. No pleural effusion or pneumothorax is noted.  IMPRESSION: Mild bilateral basilar opacities concerning for pneumonia or subsegmental atelectasis.   Electronically Signed   By: Roque Lias M.D.   On: 05/12/2013 23:56    ASSESSMENT / PLAN:  1.)  Post op odynophagia/dysphagia and fever CT with findings concerning for developing abscess, however patient's clinical presentation not consistent with significant purulent enclosed infection.  Neurosurgery has evaluated patient, feels this is post surgical changes.  Concerning however, with significant and persistent fever and systemic symptoms for infection with associated leukocytosis, fever, confusion, nausea.    Plan: Continue broad spectrum antibiotics with Vanc/Zosyn with plans for rapid de-escalation Pending procalcitonin to guide antibiotic therapy Pending blood cultures Currently improving symptoms, stable hemodynamics  2.)  DM Plan: ICU sliding scale in place Starting D5 1/2NS and continuing lantus at half dose and glucose checks around the clock.   Note patient with possible infection as well as decadron IV, may need increasing of lantus to 75% to full home  dose) Holding home dose meal time injections  3.)  HTN Plan:   Holding oral medications for now, will restart likely in AM as patient with signs at presentation of IVVD and tenuous hemodynamics (resolved)  On SCDs On PPI (can discontinue) Advance diet as tolerated   Dr Sherre Lain [facility] Pager:  [phone] 05/13/2013, 3:00 AM  Signed for Dr Ala Dach:  Levy Pupa, MD, PhD 05/13/2013, 10:31 AM [facility] [phone] or if no answer [phone]

## 2013-05-13 NOTE — Progress Notes (Signed)
INITIAL NUTRITION ASSESSMENT  Pt meets criteria for SEVERE MALNUTRITION in the context of acute illness/injury  as evidenced by wt loss of 9% x 1 month and intake </= 50% of his needs in >/= 1 week.  DOCUMENTATION CODES Per approved criteria  -Severe malnutrition in the context of acute illness or injury -Obesity Unspecified   INTERVENTION: Magic cup TID with meals, each supplement provides 290 kcal and 9 grams of protein Encouraged intake of pureed diet  NUTRITION DIAGNOSIS: Inadequate oral intake related to dysphagia and poor appetite as evidenced by MBS results, weight loss PTA, and pt report.   Goal: Pt to meet >/= 90% of their estimated nutrition needs   Monitor:  Diet advancement, PO intake, supplement acceptance, weight trend, glucose  Reason for Assessment: Pt identified as at nutrition risk on the Malnutrition Screen Tool  53 y.o. male  Admitting Dx: <principal problem not specified>  ASSESSMENT: Pt is post of ACDF 3/6, admitted with fevers and worsening dysphagia.   Possible aspiration due to dysphagia at home, per RN possible PNA. Pt on IV steroids. SLP following and started on dysphagia 1 with pudding thick liquids after MBS.  Per pt he has lost a lot of weight (9% x 1 month) since the beginning of February due to neck issues/pain. Pt was eating one meal per day at home over the last month, usually dinner which was a meat, starch and vegetable. Pt would try to consume all of this. Ate very little the rest of the day, if he snacked it would be on fruit.  Pt agreeable eating magic cups after trying sample.  Nutrition-focused physical exam did not show signs of fat or muscle wasting Blood glucose elevated, pt on IV steroids with a hx of DM.   Height: Ht Readings from Last 1 Encounters:  05/12/13 6' (1.829 m)    Weight: Wt Readings from Last 1 Encounters:  05/12/13 226 lb (102.513 kg)    Ideal Body Weight: 80.9 kg   % Ideal Body Weight: 127%  Wt Readings  from Last 10 Encounters:  05/12/13 226 lb (102.513 kg)  05/12/13 226 lb (102.513 kg)    Usual Body Weight: 247 lb  % Usual Body Weight: 91%  BMI:  Body mass index is 30.64 kg/(m^2).  Estimated Nutritional Needs: Kcal: 2100-2300 Protein: 100-120 grams Fluid: >2.1 L/day  Skin: left neck incision and left leg laceration  Diet Order: NPO  EDUCATION NEEDS: -No education needs identified at this time   Intake/Output Summary (Last 24 hours) at 05/13/13 1357 Last data filed at 05/13/13 1100  Gross per 24 hour  Intake   1025 ml  Output   1700 ml  Net   -675 ml    Last BM: 3/6   Labs:   Recent Labs Lab 05/12/13 2220 05/12/13 2337  NA 137 137  K 4.3 3.9  CL 97 99  CO2 24  --   BUN 15 14  CREATININE 0.66 0.80  CALCIUM 9.0  --   GLUCOSE 192* 188*    CBG (last 3)   Recent Labs  05/13/13 0419 05/13/13 0818 05/13/13 1201  GLUCAP 153* 252* 228*    Scheduled Meds: . dexamethasone  10 mg Intravenous 4 times per day  . docusate sodium  100 mg Oral BID  . insulin aspart  0-15 Units Subcutaneous TID WC  . insulin aspart  0-5 Units Subcutaneous QHS  . insulin glargine  52 Units Subcutaneous Daily  . pregabalin  75 mg Oral Daily  .  sodium chloride  3 mL Intravenous Q12H    Continuous Infusions: . sodium chloride Stopped (05/13/13 0420)  . 0.9 % NaCl with KCl 20 mEq / L      Past Medical History  Diagnosis Date  . Diabetes mellitus without complication   . Hypertension     Past Surgical History  Procedure Laterality Date  . Cervical spine surgery    . Hand surgery    . Leg surgery      pins noted in    Kendell Bane RD, LDN, CNSC [phone] Pager [phone] After Hours Pager

## 2013-05-13 NOTE — Progress Notes (Signed)
Name: Gerald Ross MRN: [id number] DOB: 08/05/1960    ADMISSION DATE:  05/12/2013 CONSULTATION DATE:  3/8  REFERRING MD :  Mariam DollarGary P Cram, MS PRIMARY SERVICE:  Neurosurgery  CHIEF COMPLAINT:  Dysphagia/odynophagia  BRIEF PATIENT DESCRIPTION: 53 year old male 48 hours post poster anterior cervical discetomy and fusion who presents with 24 hour history of fevers and worsening dysphagia/odynophagia.  SIGNIFICANT EVENTS / STUDIES:  CT Neck Status post surgical anterior fusion of C5-6. Soft tissue gas is seen in the soft tissues of the neck in this area which is an expected postoperative finding. Soft tissue gas is also seen in the prevertebral space which is expected postoperative finding. Fluid collection is seen in the prevertebral soft tissues space above the surgical site which does result and displacement of the hypopharyngeal airway and measures 12 mm in maximum thickness. While this may simply represent postoperative fluid, abscess cannot be excluded. These results were discussed with the referring physician.   LINES / TUBES: none  CULTURES: Blood   ANTIBIOTICS: Vanc/Zosyn  HISTORY OF PRESENT ILLNESS:   53 year old male 48 hours post poster anterior cervical discetomy and fusion who presents with 24 hour history of fevers and worsening dysphagia/odynophagia.  Patient with dizziness 3/8 and confusion (lethagy), temp of 101 that increased to >102 in the ED, associated nausea and dry heaves, poor urine output, and worsening odynophagia with post op dysphagia.  Patient with poor appetite.  Denies any other complaints.  Did not complain of rigors, diaphoresis, respiratory distress, abd pain, diarrhea, dysuria, hematuria, epistaxis, hemoptysis, ecchymosis.  Some swelling of his neck, however this has improved since 3/7   SUBJECTIVE:   VITAL SIGNS: Temp:  [97.7 F (36.5 C)-102.1 F (38.9 C)] 97.7 F (36.5 C) (03/09 0819) Pulse Rate:  [92-133] 94 (03/09 0800) Resp:  [18-27] 18 (03/09  0800) BP: (115-160)/(53-80) 160/80 mmHg (03/09 0800) SpO2:  [91 %-97 %] 97 % (03/09 0800) Weight:  [102.513 kg (226 lb)] 102.513 kg (226 lb) (03/08 2202)  PHYSICAL EXAMINATION: General:  NAD, afebrile, VSS, anicteric, non toxic appearing Neuro:  Moves all extremities, full strength, no gross CN deficit, AAOx4 HEENT:  Mild erythema, mild swelling on neck bilaterally, trachea midline, no drainage from wound site, clean dry surgical site Cardiovascular:  RRR, No murmur rubs or gallops, normal S1S2 Lungs:  Clear to auscultation, no respiratory distress, no rales/rhonchi/or wheezes noted, good air movement bilaterally  Abdomen:  Non tender, non distended, normal bowel sounds, no hepatomegaly Skin:  Clammy, but non diaphoretic, pink, warm, without rashes   Ct Soft Tissue Neck W Contrast  05/13/2013   CLINICAL DATA:  Dysphagia, fever status post cervical discectomy and fusion.  EXAM: CT NECK WITH CONTRAST  TECHNIQUE: Multidetector CT imaging of the neck was performed using the standard protocol following the bolus administration of intravenous contrast.  CONTRAST:  75mL OMNIPAQUE IOHEXOL 300 MG/ML  SOLN  COMPARISON:  None.  FINDINGS: Status post surgical anterior fusion of C5-6. Intervening spacer device is noted. Visualized portions of upper lung fields appear normal. Epiglottis appears normal. Parotid glands appear normal. No significant adenopathy is noted soft tissue gas is seen in the anterior portion the neck as well as in the prevertebral space consistent with the patient's postoperative status. There is seen fluid in the prevertebral space that demonstrates in maximum thickness of approximately 12 mm. This may simply represent postoperative fluid, but the possibility of retropharyngeal abscess cannot be excluded. This does cause some displacement of the hypopharyngeal airway. Vascular  structures appear to be intact the visualized portion of the trachea appears normal. Thyroid gland appears normal.  Visualized portions of paranasal sinuses appear normal.  IMPRESSION: Status post surgical anterior fusion of C5-6. Soft tissue gas is seen in the soft tissues of the neck in this area which is an expected postoperative finding. Soft tissue gas is also seen in the prevertebral space which is expected postoperative finding. Fluid collection is seen in the prevertebral soft tissues space above the surgical site which does result and displacement of the hypopharyngeal airway and measures 12 mm in maximum thickness. While this may simply represent postoperative fluid, abscess cannot be excluded. These results were discussed with the referring physician.   Electronically Signed   By: Roque Lias M.D.   On: 05/13/2013 00:53   Dg Chest Port 1 View  05/12/2013   CLINICAL DATA:  Shortness of breath.  EXAM: PORTABLE CHEST - 1 VIEW  COMPARISON:  None.  FINDINGS: Mild cardiomegaly is noted. Status post surgical fusion of lower cervical spine. Bilateral basilar opacities are noted concerning for pneumonia or subsegmental atelectasis. No pleural effusion or pneumothorax is noted.  IMPRESSION: Mild bilateral basilar opacities concerning for pneumonia or subsegmental atelectasis.   Electronically Signed   By: Roque Lias M.D.   On: 05/12/2013 23:56    ASSESSMENT / PLAN:  1.)  Post op odynophagia/dysphagia and fever CT with findings concerning for developing abscess, however patient's clinical presentation not consistent with significant purulent enclosed infection.  Neurosurgery has evaluated patient, feels this is post surgical changes.  Concerning however, with significant and persistent fever and systemic symptoms for infection with associated leukocytosis, fever, confusion, nausea.  Currently improving symptoms, stable hemodynamics  Plan: D/c Vanc/Zosyn 3/9 and follow fever curve, clinical status Trend Pct, first 0.14 (negative) Pending blood cultures   2.)  DM  Recent Labs Lab 05/13/13 0136 05/13/13 0419  05/13/13 0818  GLUCAP 159* 153* 252*  Plan: ICU sliding scale in place D/c D5 1/2NS 3/9 Continue lantus, increase to 75% home dose and scheduled CBG Note patient with possible infection as well as decadron IV, may need increasing of lantus to 75% to full home dose Holding home dose meal time injections  3.)  HTN Plan:   Holding oral medications for now, will restart after he passes swallow eval  On SCDs On PPI (can discontinue) Swallow eval pending today 3/9   Levy Pupa, MD, PhD 05/13/2013, 10:43 AM [facility] [phone] or if no answer [phone]

## 2013-05-13 NOTE — Anesthesia Preprocedure Evaluation (Deleted)
Anesthesia Evaluation  Patient identified by MRN, date of birth, ID band Patient awake    Reviewed: Allergy & Precautions, H&P , NPO status , Patient's Chart, lab work & pertinent test results  Airway Mallampati: II  Neck ROM: full    Dental   Pulmonary neg pulmonary ROS,          Cardiovascular hypertension,     Neuro/Psych S/P ACDF 2 days ago.  Now with post-operative fluid collection in pre-vertebral region causing dysphagia.  No difficulty breathing.    GI/Hepatic   Endo/Other  diabetes, Type 2  Renal/GU      Musculoskeletal   Abdominal   Peds  Hematology   Anesthesia Other Findings   Reproductive/Obstetrics                           Anesthesia Physical Anesthesia Plan  ASA: II  Anesthesia Plan: General   Post-op Pain Management:    Induction: Intravenous  Airway Management Planned: Oral ETT  Additional Equipment:   Intra-op Plan:   Post-operative Plan: Extubation in OR  Informed Consent: I have reviewed the patients History and Physical, chart, labs and discussed the procedure including the risks, benefits and alternatives for the proposed anesthesia with the patient or authorized representative who has indicated his/her understanding and acceptance.     Plan Discussed with: CRNA, Anesthesiologist and Surgeon  Anesthesia Plan Comments:         Anesthesia Quick Evaluation

## 2013-05-13 NOTE — ED Notes (Signed)
Family at bedside. 

## 2013-05-13 NOTE — Progress Notes (Signed)
Seen this morning and later on this afternoon. barium swallow was negative for aspiration. His voice is much better this pm. Trachea is in the midline with normal O2 saturation. Some swelling in the wound but no drainage. No pain as preop. Ct neck was seen and i decided for observation instead of surgical intervention. He and his wife are aware that i will be out of town and dr Danielle DessElsner who helped me with the surgery will be taken over

## 2013-05-13 NOTE — Progress Notes (Signed)
The H&P was done by dr Wynetta EmeryRAM  On 05/12/13 at 11.35 pm

## 2013-05-13 NOTE — Progress Notes (Signed)
Inpatient Diabetes Program Recommendations  AACE/ADA: New Consensus Statement on Inpatient Glycemic Control (2013)  Target Ranges:  Prepandial:   less than 140 mg/dL      Peak postprandial:   less than 180 mg/dL (1-2 hours)      Critically ill patients:  140 - 180 mg/dL   Reason for Assessment: Hyperglycemia  Diabetes history: Insulin-requiring diabetes Outpatient Diabetes medications: Lantus 70 units every morning, Humalog 18 units tid with meals Current orders for Inpatient glycemic control: Lantus 52 units daily, Moderate correction tid and HS scale  Note:  Patient is currently NPO.  Has order to begin Lantus 52 units daily.  Request MD change correction to q 4 hours since patient is NPO.  Thank you.  Narek Kniss S. Elsie Lincolnouth, RN, CNS, CDE Inpatient Diabetes Program, team pager [phone]

## 2013-05-13 NOTE — ED Notes (Signed)
Patient transported back to Pod-D33 from CT.

## 2013-05-13 NOTE — Evaluation (Signed)
Clinical/Bedside Swallow Evaluation Patient Details  Name: Gerald Ross MRN: [id number] Date of Birth: 07/10/1960  Today's Date: 05/13/2013 Time: 1000-1020 SLP Time Calculation (min): 20 min  Past Medical History:  Past Medical History  Diagnosis Date  . Diabetes mellitus without complication   . Hypertension    Past Surgical History:  Past Surgical History  Procedure Laterality Date  . Cervical spine surgery    . Hand surgery    . Leg surgery      pins noted in   HPI:  53 year old gentleman s/p ACDF 3/5 performed by Dr. Jeral FruitBotero has been experiencing increasing levels of dysphagia at home as well as poor appetite poor by mouth intake. Patient denies any arm pain or numbness and tingling in his hands or his legs he denies any difficulty breathing just reports a lot of difficulty swallowing feels like things are catching in his throat and increased sputum production. They have noticed the some swelling around the neck but they said it was a lot worse yesterday than it is today.  CT neck revealed Fluid collection is seen in the prevertebral soft tissues space above the surgical site which does result and displacement of the hypopharyngeal airway and measures 12 mm in maximum thickness.  PMH:  HTN, DM.  RN requested BSE after observing difficulty swallowing liquids.   Assessment / Plan / Recommendation Clinical Impression  Pt. demonstrated pharyngeal dysphagia marked by suspected motor deficits including decreased laryngeal elevation and suspected decreased pharyngeal contraction.  Immediate and delayed coughing and throat clear with ice chip likely due to pharyngeal edema s/p ACDF.  Recommend MBS to fully assess which is tentatvely scheduled for 1:00 today.    Aspiration Risk  Severe    Diet Recommendation NPO        Other  Recommendations Recommended Consults: MBS Oral Care Recommendations: Oral care Q4 per protocol   Follow Up Recommendations   (TBD)    Frequency and Duration        Pertinent Vitals/Pain WDL         Swallow Study         Oral/Motor/Sensory Function Overall Oral Motor/Sensory Function: Impaired Labial ROM: Within Functional Limits Labial Symmetry: Within Functional Limits Labial Strength: Within Functional Limits Labial Sensation: Within Functional Limits Lingual ROM: Within Functional Limits Lingual Symmetry: Within Functional Limits Lingual Strength: Within Functional Limits Lingual Sensation: Within Functional Limits Facial ROM: Within Functional Limits Facial Symmetry: Within Functional Limits Facial Strength: Within Functional Limits Facial Sensation: Within Functional Limits Velum: Impaired left (deviates to left)   Ice Chips Ice chips: Impaired Presentation: Spoon Pharyngeal Phase Impairments: Suspected delayed Swallow;Decreased hyoid-laryngeal movement;Cough - Delayed;Throat Clearing - Delayed;Throat Clearing - Immediate   Thin Liquid Thin Liquid: Not tested    Nectar Thick Nectar Thick Liquid: Not tested   Honey Thick Honey Thick Liquid: Not tested   Puree Puree: Not tested   Solid   GO    Solid: Not tested       Royce MacadamiaLisa Willis Jyssica Rief M.Ed [employer] Pager [phone]  05/13/2013

## 2013-05-13 NOTE — Procedures (Addendum)
Objective Swallowing Evaluation: Modified Barium Swallowing Study  Patient Details  Name: [name] MRN: [name] Date of Birth: 11/21/1960  Today's Date: 05/13/2013 Time: 1315-1340 SLP Time Calculation (min): 25 min  Past Medical History:  Past Medical History  Diagnosis Date  . Diabetes mellitus without complication   . Hypertension    Past Surgical History:  Past Surgical History  Procedure Laterality Date  . Cervical spine surgery    . Hand surgery    . Leg surgery      pins noted in   HPI:  53 year old gentleman s/p ACDF 3/5 performed by Dr. Jeral Ross has been experiencing increasing levels of dysphagia at home as well as poor appetite poor by mouth intake. Patient denies any arm pain or numbness and tingling in his hands or his legs he denies any difficulty breathing just reports a lot of difficulty swallowing feels like things are catching in his throat and increased sputum production. They have noticed the some swelling around the neck but they said it was a lot worse yesterday than it is today.  CT neck revealed Fluid collection is seen in the prevertebral soft tissues space above the surgical site which does result and displacement of the hypopharyngeal airway and measures 12 mm in maximum thickness.  PMH:  HTN, DM.  RN requested BSE after observing difficulty swallowing liquids.     Assessment / Plan / Recommendation Clinical Impression  Dysphagia Diagnosis: Moderate pharyngeal phase dysphagia Clinical impression: Pt. exhibited moderate pharyngeal dysphagia with primariliy motor deficits.  Laryngeal penetration and frank aspiration observed due to decreased laryngeal elevation and epiglottic closure (nectar and honey).  Chin tuck posture ineffective with honey thick liquids, however successful for reduction of pharyngeal residuals (mild-moderate).  Recommend Dys 1 diet texture and pudding thick liquids; repeat MBS possibly 3/11 for ability to safely consume liquids.  Chin tuck  posture with puree, 2 swallows, meds whole in applesauce (unless large).     Treatment Recommendation  Therapy as outlined in treatment plan below    Diet Recommendation Dysphagia 1 (Puree);Pudding-thick liquid   Liquid Administration via: Spoon Medication Administration: Whole meds with puree Supervision: Intermittent supervision to cue for compensatory strategies;Patient able to self feed Compensations: Slow rate;Small sips/bites Postural Changes and/or Swallow Maneuvers: Seated upright 90 degrees    Other  Recommendations Oral Care Recommendations: Oral care BID   Follow Up Recommendations   (TBD)    Frequency and Duration min 2x/week  2 weeks   Pertinent Vitals/Pain WDL            Reason for Referral Objectively evaluate swallowing function   Oral Phase Oral Preparation/Oral Phase Oral Phase: WFL   Pharyngeal Phase Pharyngeal Phase Pharyngeal Phase: Impaired Pharyngeal - Honey Pharyngeal - Honey Teaspoon: Penetration/Aspiration during swallow;Reduced laryngeal elevation;Reduced anterior laryngeal mobility;Pharyngeal residue - valleculae;Pharyngeal residue - pyriform sinuses;Reduced tongue base retraction Penetration/Aspiration details (honey teaspoon): Material enters airway, passes BELOW cords and not ejected out despite cough attempt by patient Pharyngeal - Honey Cup: Penetration/Aspiration during swallow;Reduced laryngeal elevation;Pharyngeal residue - valleculae;Pharyngeal residue - pyriform sinuses;Reduced tongue base retraction;Reduced airway/laryngeal closure Penetration/Aspiration details (honey cup): Material enters airway, remains ABOVE vocal cords then ejected out;Material enters airway, remains ABOVE vocal cords and not ejected out Pharyngeal - Nectar Pharyngeal - Nectar Teaspoon: Pharyngeal residue - pyriform sinuses;Pharyngeal residue - valleculae;Penetration/Aspiration during swallow;Reduced laryngeal elevation;Reduced airway/laryngeal closure;Reduced tongue  base retraction Penetration/Aspiration details (nectar teaspoon): Material enters airway, remains ABOVE vocal cords then ejected out;Material enters airway, remains ABOVE vocal cords and not  ejected out Pharyngeal - Solids Pharyngeal - Puree: Pharyngeal residue - valleculae;Pharyngeal residue - pyriform sinuses;Reduced laryngeal elevation;Reduced tongue base retraction  Cervical Esophageal Phase    GO    Cervical Esophageal Phase Cervical Esophageal Phase: [facility]         Darrow Bussing.Ed [employer] [phone]  05/13/2013

## 2013-05-13 NOTE — Progress Notes (Signed)
ANTIBIOTIC CONSULT NOTE - INITIAL  Pharmacy Consult for Vancomycin Indication: possible wound infection  Allergies  Allergen Reactions  . Sulfa Antibiotics Nausea And Vomiting    Patient Measurements: Height: 6' (182.9 cm) Weight: 226 lb (102.513 kg) IBW/kg (Calculated) : 77.6  Vital Signs: Temp: 99.8 F (37.7 C) (03/09 0317) Temp src: Oral (03/09 0317) BP: 150/73 mmHg (03/09 0300) Pulse Rate: 100 (03/09 0300) Intake/Output from previous day:   Intake/Output from this shift:    Labs:  Recent Labs  05/12/13 2220 05/12/13 2337  WBC 17.5*  --   HGB 14.3 13.6  PLT 241  --   CREATININE 0.66 0.80   Estimated Creatinine Clearance: 130.8 ml/min (by C-G formula based on Cr of 0.8). No results found for this basename: VANCOTROUGH, VANCOPEAK, VANCORANDOM, GENTTROUGH, GENTPEAK, GENTRANDOM, TOBRATROUGH, TOBRAPEAK, TOBRARND, AMIKACINPEAK, AMIKACINTROU, AMIKACIN,  in the last 72 hours   Microbiology: No results found for this or any previous visit (from the past 720 hour(s)).  Medical History: Past Medical History  Diagnosis Date  . Diabetes mellitus without complication   . Hypertension     Medications:  Scheduled:  . dexamethasone  10 mg Intravenous 4 times per day  . docusate sodium  100 mg Oral BID  . insulin aspart  0-15 Units Subcutaneous TID WC  . insulin aspart  0-5 Units Subcutaneous QHS  . insulin glargine  35 Units Subcutaneous Daily  . pantoprazole (PROTONIX) IV  40 mg Intravenous Q12H  . piperacillin-tazobactam (ZOSYN)  IV  3.375 g Intravenous 4 times per day  . pregabalin  75 mg Oral Daily  . sodium chloride  3 mL Intravenous Q12H   Assessment: 53 yo male s/p cervical discectomy 3/6, now with possible wound infection, for empiric antibiotics.  Vancomycin 1 g IV given in ED at 0045  Goal of Therapy:  Vancomycin trough level 15-20 mcg/ml  Plan:  Vancomycin 1 g IV q8h  Alynna Hargrove, Gary FleetGregory Vernon 05/13/2013,3:27 AM

## 2013-05-14 DIAGNOSIS — A419 Sepsis, unspecified organism: Secondary | ICD-10-CM

## 2013-05-14 DIAGNOSIS — J189 Pneumonia, unspecified organism: Secondary | ICD-10-CM

## 2013-05-14 LAB — BASIC METABOLIC PANEL
BUN: 14 mg/dL (ref 6–23)
CHLORIDE: 106 meq/L (ref 96–112)
CO2: 24 meq/L (ref 19–32)
CREATININE: 0.44 mg/dL — AB (ref 0.50–1.35)
Calcium: 9 mg/dL (ref 8.4–10.5)
GFR calc Af Amer: >90 mL/min (ref >90)
GFR calc non Af Amer: >90 mL/min (ref >90)
Glucose, Bld: 236 mg/dL — ABNORMAL HIGH (ref 70–99)
Potassium: 4 mEq/L (ref 3.7–5.3)
Sodium: 145 mEq/L (ref 137–147)

## 2013-05-14 LAB — CBC
HEMATOCRIT: 38.9 % — AB (ref 39.0–52.0)
Hemoglobin: 13.7 g/dL (ref 13.0–17.0)
MCH: 32.8 pg (ref 26.0–34.0)
MCHC: 35.2 g/dL (ref 30.0–36.0)
MCV: 93.1 fL (ref 78.0–100.0)
Platelets: 268 10*3/uL (ref 150–400)
RBC: 4.18 MIL/uL — ABNORMAL LOW (ref 4.22–5.81)
RDW: 12.4 % (ref 11.5–15.5)
WBC: 15.1 10*3/uL — ABNORMAL HIGH (ref 4.0–10.5)

## 2013-05-14 LAB — GLUCOSE, CAPILLARY
GLUCOSE-CAPILLARY: 279 mg/dL — AB (ref 70–99)
GLUCOSE-CAPILLARY: 321 mg/dL — AB (ref 70–99)
GLUCOSE-CAPILLARY: 296 mg/dL — AB (ref 70–99)
GLUCOSE-CAPILLARY: 248 mg/dL — AB (ref 70–99)

## 2013-05-14 LAB — PROCALCITONIN: Procalcitonin: <0.1 ng/mL

## 2013-05-14 MED ORDER — LISINOPRIL 5 MG PO TABS
5.0000 mg | ORAL_TABLET | Freq: Every day | ORAL | Status: DC
  Administered 2013-05-14 – 2013-05-15 (×2): 5 mg via ORAL
  Filled 2013-05-14 (×3): qty 1

## 2013-05-14 MED ORDER — INSULIN ASPART 100 UNIT/ML ~~LOC~~ SOLN
10.0000 [IU] | Freq: Three times a day (TID) | SUBCUTANEOUS | Status: DC
  Administered 2013-05-14 – 2013-05-15 (×4): 10 [IU] via SUBCUTANEOUS

## 2013-05-14 MED ORDER — INSULIN GLARGINE 100 UNIT/ML ~~LOC~~ SOLN
70.0000 [IU] | Freq: Every day | SUBCUTANEOUS | Status: DC
  Administered 2013-05-15: 70 [IU] via SUBCUTANEOUS
  Filled 2013-05-14 (×2): qty 0.7

## 2013-05-14 NOTE — Progress Notes (Signed)
Doing well, no weakness. Voice better. Ate . Spoke with his wife and i introduced dr Danielle DessElsner to them. Dc in am?

## 2013-05-14 NOTE — Progress Notes (Signed)
eLink Physician-Brief Progress Note Patient Name: Gerald MaladyGrant Colcord DOB: 09/22/1960 MRN: [id number]  Date of Service  05/14/2013   HPI/Events of Note  Camera check reveals patient with changes in O2 sats while sleeping suggestive of sleep apnea.  Patient on RA and cycles from highs of 97 to lows of 85%.  Cycles are consistent during deep sleep.   eICU Interventions  Plan: Add Oxygen therapy May require a sleep study at some point      Julian Askin 05/14/2013, 3:29 AM

## 2013-05-14 NOTE — Progress Notes (Signed)
Name: Gerald Ross MRN: [id number] DOB: 09/28/1960    ADMISSION DATE:  05/12/2013 CONSULTATION DATE:  3/8  REFERRING MD :  Mariam DollarGary P Cram, MS PRIMARY SERVICE:  Neurosurgery  CHIEF COMPLAINT:  Dysphagia/odynophagia  BRIEF PATIENT DESCRIPTION: 53 year old male 48 hours post poster anterior cervical discetomy and fusion who presents with 24 hour history of fevers and worsening dysphagia/odynophagia.  SIGNIFICANT EVENTS / STUDIES:  CT Neck Status post surgical anterior fusion of C5-6. Soft tissue gas is seen in the soft tissues of the neck in this area which is an expected postoperative finding. Soft tissue gas is also seen in the prevertebral space which is expected postoperative finding. Fluid collection is seen in the prevertebral soft tissues space above the surgical site which does result and displacement of the hypopharyngeal airway and measures 12 mm in maximum thickness. While this may simply represent postoperative fluid, abscess cannot be excluded. These results were discussed with the referring physician.   LINES / TUBES: none  CULTURES: Blood   ANTIBIOTICS: Vanc/Zosyn  HISTORY OF PRESENT ILLNESS:   53 year old male 48 hours post poster anterior cervical discetomy and fusion who presents with 24 hour history of fevers and worsening dysphagia/odynophagia.  Patient with dizziness 3/8 and confusion (lethagy), temp of 101 that increased to >102 in the ED, associated nausea and dry heaves, poor urine output, and worsening odynophagia with post op dysphagia.  Patient with poor appetite.  Denies any other complaints.  Did not complain of rigors, diaphoresis, respiratory distress, abd pain, diarrhea, dysuria, hematuria, epistaxis, hemoptysis, ecchymosis.  Some swelling of his neck, however this has improved since 3/7   SUBJECTIVE:  Feels better. Neck pain improved Passed swallow for modified diet  VITAL SIGNS: Temp:  [97.7 F (36.5 C)-98.6 F (37 C)] 97.7 F (36.5 C) (03/10  0800) Pulse Rate:  [95-121] 107 (03/10 0900) Resp:  [13-26] 17 (03/10 0900) BP: (117-157)/(50-89) 152/88 mmHg (03/10 0900) SpO2:  [90 %-97 %] 96 % (03/10 0900) FiO2 (%):  [30 %] 30 % (03/10 0341)  PHYSICAL EXAMINATION: General:  NAD, afebrile, VSS, anicteric, non toxic appearing Neuro:  Moves all extremities, full strength, no gross CN deficit, AAOx4 HEENT:  Mild erythema, mild swelling on neck bilaterally, trachea midline, no drainage from wound site, clean dry surgical site Cardiovascular:  RRR, No murmur rubs or gallops, normal S1S2 Lungs:  Clear to auscultation, no respiratory distress, no rales/rhonchi/or wheezes noted, good air movement bilaterally  Abdomen:  Non tender, non distended, normal bowel sounds, no hepatomegaly Skin:  Clammy, but non diaphoretic, pink, warm, without rashes   Ct Soft Tissue Neck W Contrast  05/13/2013   CLINICAL DATA:  Dysphagia, fever status post cervical discectomy and fusion.  EXAM: CT NECK WITH CONTRAST  TECHNIQUE: Multidetector CT imaging of the neck was performed using the standard protocol following the bolus administration of intravenous contrast.  CONTRAST:  75mL OMNIPAQUE IOHEXOL 300 MG/ML  SOLN  COMPARISON:  None.  FINDINGS: Status post surgical anterior fusion of C5-6. Intervening spacer device is noted. Visualized portions of upper lung fields appear normal. Epiglottis appears normal. Parotid glands appear normal. No significant adenopathy is noted soft tissue gas is seen in the anterior portion the neck as well as in the prevertebral space consistent with the patient's postoperative status. There is seen fluid in the prevertebral space that demonstrates in maximum thickness of approximately 12 mm. This may simply represent postoperative fluid, but the possibility of retropharyngeal abscess cannot be excluded. This does cause  some displacement of the hypopharyngeal airway. Vascular structures appear to be intact the visualized portion of the trachea  appears normal. Thyroid gland appears normal. Visualized portions of paranasal sinuses appear normal.  IMPRESSION: Status post surgical anterior fusion of C5-6. Soft tissue gas is seen in the soft tissues of the neck in this area which is an expected postoperative finding. Soft tissue gas is also seen in the prevertebral space which is expected postoperative finding. Fluid collection is seen in the prevertebral soft tissues space above the surgical site which does result and displacement of the hypopharyngeal airway and measures 12 mm in maximum thickness. While this may simply represent postoperative fluid, abscess cannot be excluded. These results were discussed with the referring physician.   Electronically Signed   By: Roque Lias M.D.   On: 05/13/2013 00:53   Dg Chest Port 1 View  05/12/2013   CLINICAL DATA:  Shortness of breath.  EXAM: PORTABLE CHEST - 1 VIEW  COMPARISON:  None.  FINDINGS: Mild cardiomegaly is noted. Status post surgical fusion of lower cervical spine. Bilateral basilar opacities are noted concerning for pneumonia or subsegmental atelectasis. No pleural effusion or pneumothorax is noted.  IMPRESSION: Mild bilateral basilar opacities concerning for pneumonia or subsegmental atelectasis.   Electronically Signed   By: Roque Lias M.D.   On: 05/12/2013 23:56   Dg Swallowing Func-speech Pathology  05/13/2013   Breck Coons Benson, CCC-SLP     05/13/2013  3:19 PM Objective Swallowing Evaluation: Modified Barium Swallowing Study   Patient Details  Name: Gerald Ross MRN: [id number] Date of Birth: October 14, 1960  Today's Date: 05/13/2013 Time: 1315-1340 SLP Time Calculation (min): 25 min  Past Medical History:  Past Medical History  Diagnosis Date  . Diabetes mellitus without complication   . Hypertension    Past Surgical History:  Past Surgical History  Procedure Laterality Date  . Cervical spine surgery    . Hand surgery    . Leg surgery      pins noted in   HPI:  53 year old gentleman s/p ACDF 3/5  performed by Dr. Jeral Ross has  been experiencing increasing levels of dysphagia at home as well  as poor appetite poor by mouth intake. Patient denies any arm  pain or numbness and tingling in his hands or his legs he denies  any difficulty breathing just reports a lot of difficulty  swallowing feels like things are catching in his throat and  increased sputum production. They have noticed the some swelling  around the neck but they said it was a lot worse yesterday than  it is today.  CT neck revealed Fluid collection is seen in the  prevertebral soft tissues space above the surgical site which  does result and displacement of the hypopharyngeal airway and  measures 12 mm in maximum thickness.  PMH:  HTN, DM.  RN  requested BSE after observing difficulty swallowing liquids.     Assessment / Plan / Recommendation Clinical Impression  Dysphagia Diagnosis: Moderate pharyngeal phase dysphagia Clinical impression: Pt. exhibited moderate pharyngeal dysphagia  with primariliy motor deficits.  Laryngeal penetration and frank  aspiration observed due to decreased laryngeal elevation and  epiglottic closure (nectar and honey).  Chin tuck posture  ineffective with honey thick liquids, however successful for  reduction of pharyngeal residuals (mild-moderate).  Recommend Dys  1 diet texture and pudding thick liquids; repeat MBS possibly  3/11 for ability to safely consume liquids.  Chin tuck posture  with puree, 2 swallows, meds whole  in applesauce (unless large).     Treatment Recommendation  Therapy as outlined in treatment plan below    Diet Recommendation Dysphagia 1 (Puree);Pudding-thick liquid   Liquid Administration via: Spoon Medication Administration: Whole meds with puree Supervision: Intermittent supervision to cue for compensatory  strategies;Patient able to self feed Compensations: Slow rate;Small sips/bites Postural Changes and/or Swallow Maneuvers: Seated upright 90  degrees    Other  Recommendations Oral Care  Recommendations: Oral care BID   Follow Up Recommendations   (TBD)    Frequency and Duration min 2x/week  2 weeks   Pertinent Vitals/Pain WDL            Reason for Referral Objectively evaluate swallowing function   Oral Phase Oral Preparation/Oral Phase Oral Phase: WFL   Pharyngeal Phase Pharyngeal Phase Pharyngeal Phase: Impaired Pharyngeal - Honey Pharyngeal - Honey Teaspoon: Penetration/Aspiration during  swallow;Reduced laryngeal elevation;Reduced anterior laryngeal  mobility;Pharyngeal residue - valleculae;Pharyngeal residue -  pyriform sinuses;Reduced tongue base retraction Penetration/Aspiration details (honey teaspoon): Material enters  airway, passes BELOW cords and not ejected out despite cough  attempt by patient Pharyngeal - Honey Cup: Penetration/Aspiration during  swallow;Reduced laryngeal elevation;Pharyngeal residue -  valleculae;Pharyngeal residue - pyriform sinuses;Reduced tongue  base retraction;Reduced airway/laryngeal closure Penetration/Aspiration details (honey cup): Material enters  airway, remains ABOVE vocal cords then ejected out;Material  enters airway, remains ABOVE vocal cords and not ejected out Pharyngeal - Nectar Pharyngeal - Nectar Teaspoon: Pharyngeal residue - pyriform  sinuses;Pharyngeal residue - valleculae;Penetration/Aspiration  during swallow;Reduced laryngeal elevation;Reduced  airway/laryngeal closure;Reduced tongue base retraction Penetration/Aspiration details (nectar teaspoon): Material enters  airway, remains ABOVE vocal cords then ejected out;Material  enters airway, remains ABOVE vocal cords and not ejected out Pharyngeal - Solids Pharyngeal - Puree: Pharyngeal residue - valleculae;Pharyngeal  residue - pyriform sinuses;Reduced laryngeal elevation;Reduced  tongue base retraction  Cervical Esophageal Phase    GO    Cervical Esophageal Phase Cervical Esophageal Phase: [facility]         Darrow Bussing.Ed CCC-SLP Pager [phone]  05/13/2013    ASSESSMENT / PLAN:  1.)   Post op odynophagia/dysphagia and fever CT with findings concerning for developing abscess, however patient's clinical presentation not consistent with significant purulent enclosed infection.  Neurosurgery has evaluated patient, feels this is post surgical changes.  Clinically improved  Plan: D/c'd Uvaldo Bristle 3/9 and follow fever curve, clinical status Trend Pct, first 0.14 (negative) > 0.10 Pending blood cultures   2.)  DM  Recent Labs Lab 05/13/13 0818 05/13/13 1201 05/13/13 1659 05/13/13 2134 05/14/13 0829  GLUCAP 252* 228* 292* 247* 279*  Plan: ICU sliding scale in place Continue lantus, increase to home dose and scheduled CBG Add novolog 10u before meals on 3/10 (he is on humolog at home, 18u w meals).  3.)  HTN Plan:   Restart oral meds > lisinopril 5,   PCCM will sign off. Please call if we can help in any way  Levy Pupa, MD, PhD 05/14/2013, 9:59 AM [facility] [phone] or if no answer [phone]

## 2013-05-14 NOTE — Progress Notes (Signed)
Speech Language Pathology   Patient Details Name: Gerald MaladyGrant Zody MRN: [id number] DOB: 03/08/1960 Today's Date: 05/14/2013 Time:  -      Pt. Will need repeat MBS 3/11 prior to discharge due to GROSS aspiration of honey and nectar thick liquids during MBS 3/9 to determine improvements in swallow and recommendation for safest liquid intake.  Please order if agree.  Breck CoonsLisa Willis Thompson SpringsLitaker M.Ed [employer]SLP Pager [phone]  05/14/2013

## 2013-05-15 ENCOUNTER — Inpatient Hospital Stay (HOSPITAL_COMMUNITY): Payer: Medicare Other

## 2013-05-15 LAB — GLUCOSE, CAPILLARY
GLUCOSE-CAPILLARY: 142 mg/dL — AB (ref 70–99)
GLUCOSE-CAPILLARY: 268 mg/dL — AB (ref 70–99)
GLUCOSE-CAPILLARY: 269 mg/dL — AB (ref 70–99)
Glucose-Capillary: 266 mg/dL — ABNORMAL HIGH (ref 70–99)
Glucose-Capillary: 188 mg/dL — ABNORMAL HIGH (ref 70–99)

## 2013-05-15 LAB — PROCALCITONIN: Procalcitonin: 0.21 ng/mL

## 2013-05-15 MED ORDER — INSULIN ASPART 100 UNIT/ML ~~LOC~~ SOLN
3.0000 [IU] | Freq: Once | SUBCUTANEOUS | Status: AC
  Administered 2013-05-15: 3 [IU] via SUBCUTANEOUS

## 2013-05-15 NOTE — Progress Notes (Signed)
Noted blood sugar to be 269 at 0000 CBG check.  MD made aware, discussed pt's plan of care-diet and medications. Instructed to give pt 3units Novolog.

## 2013-05-15 NOTE — Discharge Summary (Addendum)
Physician Discharge Summary  Patient ID: Gerald MaladyGrant Ross MRN: [id number] DOB/AGE: 55/08/1960 55 y.o.  Admit date: 05/12/2013 Discharge date: 05/16/13  Admission Diagnoses: Dysphagia status post anterior cervical decompression C5-C6  Discharge Diagnoses: Dysphagia status post anterior cervical decompression C5-C6  Active Problems:   Dysphagia   DM (diabetes mellitus)   HTN (hypertension)   Protein-calorie malnutrition, severe   Discharged Condition: good  Hospital Course: Patient was admitted having had an outpatient anterior decompression arthrodesis a few days ago. He had developed significant dysphasia neck pain. He was treated with some IV steroids. Symptoms resolved.  Consults: None  Significant Diagnostic Studies: CT cervical  Treatments: steroids: Decadron  Discharge Exam: Blood pressure 135/84, pulse 84, temperature 97.3 F (36.3 C), temperature source Oral, resp. rate 21, height 6' (1.829 m), weight 102.513 kg (226 lb), SpO2 94.00%. Incision is clean and dry motor function is intact in both upper and lower extremities. Station and gait is normal. Swallowing function is normal.  Disposition: Discharge home. Follow up MBS as out patient.  Discharge Orders   Future Orders Complete By Expires   Call MD for:  redness, tenderness, or signs of infection (pain, swelling, redness, odor or green/yellow discharge around incision site)  As directed    Call MD for:  severe uncontrolled pain  As directed    Call MD for:  temperature >100.4  As directed    Diet - low sodium heart healthy  As directed    Increase activity slowly  As directed        Medication List    TAKE these medications       aspirin EC 81 MG tablet  Take 81 mg by mouth daily.     atorvastatin 20 MG tablet  Commonly known as:  LIPITOR  Take 20 mg by mouth daily.     HYDROcodone-acetaminophen 5-325 MG per tablet  Commonly known as:  NORCO/VICODIN  Take 1 tablet by mouth every 6 (six) hours as needed for  moderate pain.     insulin glargine 100 UNIT/ML injection  Commonly known as:  LANTUS  Inject 70 Units into the skin every morning.     insulin lispro 100 UNIT/ML injection  Commonly known as:  HUMALOG  Inject 18 Units into the skin 3 (three) times daily before meals.     lisinopril 5 MG tablet  Commonly known as:  PRINIVIL,ZESTRIL  Take 5 mg by mouth daily.     MULTIVITAMIN PO  Take 1 tablet by mouth daily.     oxyCODONE 15 MG immediate release tablet  Commonly known as:  ROXICODONE  Take 15 mg by mouth every 4 (four) hours as needed for pain.     pregabalin 75 MG capsule  Commonly known as:  LYRICA  Take 75 mg by mouth daily.      ASK your doctor about these medications       diazepam 5 MG tablet  Commonly known as:  VALIUM  Take 5 mg by mouth every 6 (six) hours as needed for anxiety or muscle spasms.         SignedStefani Dama: [name] 05/15/2013, 7:54 PM

## 2013-05-15 NOTE — Progress Notes (Signed)
Inpatient Diabetes Program Recommendations  AACE/ADA: New Consensus Statement on Inpatient Glycemic Control (2013)  Target Ranges:  Prepandial:   less than 140 mg/dL      Peak postprandial:   less than 180 mg/dL (1-2 hours)      Critically ill patients:  140 - 180 mg/dL   Results for Gerald Ross, Gerald Ross (MRN [id number]) as of 05/15/2013 14:17  Ref. Range 05/14/2013 08:29 05/14/2013 11:19 05/14/2013 17:33 05/14/2013 19:41 05/15/2013 00:11 05/15/2013 08:09 05/15/2013 12:15  Glucose-Capillary Latest Range: 70-99 mg/dL 086279 (H) 578321 (H) 469296 (H) 248 (H) 269 (H) 268 (H) 266 (H)    Diabetes history: DM2 Outpatient Diabetes medications: Lantus 70 units QAM, Humalog 18 units TID with meals Current orders for Inpatient glycemic control: Lantus 70 units daily, Novolog 0-15 units AC, Novolog 0-5 units HS, Novolog 10 units TID with meals  Inpatient Diabetes Program Recommendations Correction (SSI): Please increase Novolog correction to resistant scale ACHS if regular Glycemic Control order set will be continued. Noted order for Phase 1 of ICU Glycemic Control order set ordered 3/9; however the ICU Glycemic order set for SQ insulin is not currently ordered.  Instead the regular Glycemic Control ACHS order set with the Moderate correction scale has been ordered.   Note: Noted Lantus was increased yesterday to start today.  Therefore patient received Lantus 70 units today at 11:10 (received Lantus 52 units on 3/10).  Noted there is an order for Phase 1 of ICU Glycemic Control order set which was ordered 3/9; however the ICU Glycemic order set for SQ insulin is not currently ordered.  Instead the regular Glycemic Control ACHS order set with the Moderate correction scale has been ordered. If regular Glycemic control order set will be continued, please increase to resistant scale.  If MD intends to use the ICU glycemic order set then please discontinue current Glycemic Control ACHS order set and order the correct phase of the ICU  Glycemic order set.  Thanks, Orlando PennerMarie Kimari Coudriet, RN, MSN, CCRN Diabetes Coordinator Inpatient Diabetes Program [phone] (Team Pager) [phone] ([place] office) [phone] [facility] office)

## 2013-05-15 NOTE — Progress Notes (Signed)
Speech Language Pathology Treatment: Dysphagia  Patient Details Name: Gerald Ross MRN: [id number] DOB: [birth year] Today's Date: 05/15/2013 Time: 1140-1200 SLP Time Calculation (min): 20 min  Assessment / Plan / Recommendation Clinical Impression  Pt. Seen for dysphagia education with pt/wife following MBS earlier today.  He required occasional min verbal/visual cueing with thin via teaspoon and chin tuck.  Appeared to tolerate without difficulty.  Education provided regarding indications of poor tolerance of liquids (per wife request)  (congestion with fever etc) as well as process of scheduling repeat MBS as outpatient.  Wife given number to rehab office, however Dr. Harley Hallmark office should be able to contact rehab office to schedule [phone]).    HPI HPI: 53 year old gentleman s/p ACDF 3/5 performed by Dr. Joya Salm has been experiencing increasing levels of dysphagia at home as well as poor appetite poor by mouth intake. Patient denies any arm pain or numbness and tingling in his hands or his legs he denies any difficulty breathing just reports a lot of difficulty swallowing feels like things are catching in his throat and increased sputum production. They have noticed the some swelling around the neck but they said it was a lot worse yesterday than it is today.  CT neck revealed Fluid collection is seen in the prevertebral soft tissues space above the surgical site which does result and displacement of the hypopharyngeal airway and measures 12 mm in maximum thickness.  PMH:  HTN, DM.  RN requested BSE after observing difficulty swallowing liquids.   Pertinent Vitals WDL  SLP Plan  All goals met    Recommendations Diet recommendations: Dysphagia 3 (mechanical soft);Thin liquid Liquids provided via: Teaspoon Medication Administration: Whole meds with puree Supervision: Intermittent supervision to cue for compensatory strategies;Patient able to self feed Compensations: Slow rate;Small  sips/bites;Multiple dry swallows after each bite/sip;Clear throat intermittently Postural Changes and/or Swallow Maneuvers: Seated upright 90 degrees;Chin tuck (tuck food/liq)              Oral Care Recommendations: Oral care BID Follow up Recommendations:  (repeat MBS in 2 weeks for upgrade to cup sips) Plan: All goals met    GO     [place] Siren M.Ed [employer] [phone]  05/15/2013

## 2013-05-15 NOTE — Procedures (Signed)
Objective Swallowing Evaluation: Modified Barium Swallowing Study  Patient Details  Name: Gerald Ross MRN: [id number] Date of Birth: 03/07/1961  Today's Date: 05/15/2013 Time: 1010-1025 SLP Time Calculation (min): 15 min  Past Medical History:  Past Medical History  Diagnosis Date  . Diabetes mellitus without complication   . Hypertension    Past Surgical History:  Past Surgical History  Procedure Laterality Date  . Cervical spine surgery    . Hand surgery    . Leg surgery      pins noted in   HPI:  53 year old gentleman s/p ACDF 3/5 performed by Dr. Jeral Ross has been experiencing increasing levels of dysphagia at home as well as poor appetite poor by mouth intake. Patient denies any arm pain or numbness and tingling in his hands or his legs he denies any difficulty breathing just reports a lot of difficulty swallowing feels like things are catching in his throat and increased sputum production. They have noticed the some swelling around the neck but they said it was a lot worse yesterday than it is today.  CT neck revealed Fluid collection is seen in the prevertebral soft tissues space above the surgical site which does result and displacement of the hypopharyngeal airway and measures 12 mm in maximum thickness.  PMH:  HTN, DM.  RN requested BSE after observing difficulty swallowing liquids.     Assessment / Plan / Recommendation Clinical Impression  Dysphagia Diagnosis: Mild pharyngeal phase dysphagia;Moderate pharyngeal phase dysphagia Clinical impression: Pt exhibited mild-moderate pharyngeal dysphagia with sensory/motor deficits.  Deep silent laryngeal penetration and aspiration noted due to decreased laryngeal elevation and laryngeal closure and sensory impairment prior to (with thin via tsp) and during (with thin via cup) ; subsequent swallows with chin tuck effective with tsp amounts of thin liquids. Chin tuck also effective with reduction of mild-moderate vallecular residue noted  due to decreased base of tongue retraction with all consistencies tested; Recommend upgrade to Dys 3 diet with thin via tsp utilizing chin tuck technique with all consistencies; f/u MBS in 2-3 weeks for potential upgrade to cup sips of thin liquid due to silent nature of aspiration.    Treatment Recommendation  No treatment recommended at this time    Diet Recommendation Dysphagia 3 (Mechanical Soft);Thin liquid (with tsp amounts and chin tuck)   Liquid Administration via: Spoon Medication Administration: Whole meds with puree Supervision: Intermittent supervision to cue for compensatory strategies;Patient able to self feed Compensations: Slow rate;Small sips/bites Postural Changes and/or Swallow Maneuvers: Seated upright 90 degrees    Other  Recommendations Recommended Consults: MBS (2-3 weeks) Oral Care Recommendations: Oral care BID   Follow Up Recommendations  None    Frequency and Duration        Pertinent Vitals/Pain No pain indicated; BP increased intermittently throughout past 2 days     General Date of Onset: 05/12/13 HPI: 53 year old gentleman s/p ACDF 3/5 performed by Dr. Jeral Ross has been experiencing increasing levels of dysphagia at home as well as poor appetite poor by mouth intake. Patient denies any arm pain or numbness and tingling in his hands or his legs he denies any difficulty breathing just reports a lot of difficulty swallowing feels like things are catching in his throat and increased sputum production. They have noticed the some swelling around the neck but they said it was a lot worse yesterday than it is today.  CT neck revealed Fluid collection is seen in the prevertebral soft tissues space above the surgical site which does result  and displacement of the hypopharyngeal airway and measures 12 mm in maximum thickness.  PMH:  HTN, DM.  RN requested BSE after observing difficulty swallowing liquids. Type of Study: Modified Barium Swallowing Study Reason for  Referral: Objectively evaluate swallowing function Previous Swallow Assessment: MBS completed on 05/13/13 indicating need for pudding thick liquids/Dys 1 diet  Diet Prior to this Study: Dysphagia 1 (puree);Pudding-thick liquids Temperature Spikes Noted: No Respiratory Status: Room air History of Recent Intubation: Yes Date extubated: 05/09/13 Behavior/Cognition: Cooperative;Pleasant mood;Alert Oral Cavity - Dentition: Dentures, top Oral Motor / Sensory Function: Impaired - see Bedside swallow eval Self-Feeding Abilities: Able to feed self Patient Positioning: Upright in chair Baseline Vocal Quality: Clear Volitional Cough: Strong Volitional Swallow: Able to elicit Anatomy: Within functional limits Pharyngeal Secretions: Not observed secondary MBS    Reason for Referral Objectively evaluate swallowing function   Oral Phase  WDL   Pharyngeal Phase Pharyngeal Phase Pharyngeal Phase: Impaired Pharyngeal - Pudding Pharyngeal - Pudding Teaspoon: Reduced tongue base retraction Pharyngeal - Pudding Cup: Reduced tongue base retraction Pharyngeal - Honey Pharyngeal - Honey Teaspoon: Reduced tongue base retraction;Pharyngeal residue - valleculae Penetration/Aspiration details (honey teaspoon): Material does not enter airway Pharyngeal - Honey Cup: Reduced tongue base retraction;Pharyngeal residue - valleculae Penetration/Aspiration details (honey cup): Material does not enter airway Pharyngeal - Nectar Pharyngeal - Nectar Teaspoon: Reduced tongue base retraction;Pharyngeal residue - valleculae Penetration/Aspiration details (nectar teaspoon): Material does not enter airway Pharyngeal - Nectar Cup: Reduced tongue base retraction;Pharyngeal residue - valleculae Pharyngeal - Thin Pharyngeal - Thin Teaspoon: Reduced tongue base retraction;Penetration/Aspiration before swallow;Pharyngeal residue - valleculae;Compensatory strategies attempted (Comment) (chin tuck) Penetration/Aspiration details  (thin teaspoon): Material enters airway, remains ABOVE vocal cords and not ejected out Pharyngeal - Thin Cup: Reduced tongue base retraction;Penetration/Aspiration during swallow;Pharyngeal residue - valleculae;Compensatory strategies attempted (Comment);Reduced laryngeal elevation (chin tuck) Penetration/Aspiration details (thin cup): Material enters airway, passes BELOW cords then ejected out Pharyngeal - Solids Pharyngeal - Puree: Pharyngeal residue - valleculae;Reduced tongue base retraction;Reduced laryngeal elevation;Compensatory strategies attempted (Comment) (chin tuck) Pharyngeal - Mechanical Soft: Reduced tongue base retraction;Pharyngeal residue - valleculae;Compensatory strategies attempted (Comment) (chin tuck)  Cervical Esophageal Phase        Cervical Esophageal Phase Cervical Esophageal Phase: WFL         ADAMS,PAT, CCC-SLP 05/15/2013, 12:27 PM

## 2013-05-16 LAB — GLUCOSE, CAPILLARY: Glucose-Capillary: 166 mg/dL — ABNORMAL HIGH (ref 70–99)

## 2013-05-16 MED ORDER — DEXAMETHASONE 1 MG PO TABS: ORAL_TABLET | ORAL | Status: AC

## 2013-05-16 NOTE — Progress Notes (Signed)
Speech Language Pathology Treatment: Dysphagia  Patient Details Name: Gerald Ross MRN: [id number] DOB: [birth year] Today's Date: 05/16/2013 Time: 1572-6203 SLP Time Calculation (min): 12 min  Assessment / Plan / Recommendation Clinical Impression  SLP paged by RN that pt.'s vocal quality is somewhat more hoarse today.  Pt./wife reported he "let the water get away from him yesterday and got strangled."  He admitted his sip size was too large.  This morning he required one min verbal reminder for sequence of chin tuck.  No s/s aspiration during observation.  SLP educated pt./wife regarding Thicken Up Clear if needed at home.  Pt. Plans to return in 2-3 weeks for follow up MBS to advance to larger sips via cup due to silent nature of aspiration on MBS.   HPI HPI: 53 year old gentleman s/p ACDF 3/5 performed by Dr. Joya Salm has been experiencing increasing levels of dysphagia at home as well as poor appetite poor by mouth intake. Patient denies any arm pain or numbness and tingling in his hands or his legs he denies any difficulty breathing just reports a lot of difficulty swallowing feels like things are catching in his throat and increased sputum production. They have noticed the some swelling around the neck but they said it was a lot worse yesterday than it is today.  CT neck revealed Fluid collection is seen in the prevertebral soft tissues space above the surgical site which does result and displacement of the hypopharyngeal airway and measures 12 mm in maximum thickness.  PMH:  HTN, DM.  RN requested BSE after observing difficulty swallowing liquids.   Pertinent Vitals WDL  SLP Plan  All goals met    Recommendations Liquids provided via: Teaspoon Medication Administration: Whole meds with puree Supervision: Intermittent supervision to cue for compensatory strategies;Patient able to self feed Compensations: Slow rate;Small sips/bites;Multiple dry swallows after each bite/sip;Clear throat  intermittently Postural Changes and/or Swallow Maneuvers: Seated upright 90 degrees;Chin tuck              Oral Care Recommendations: Oral care BID Follow up Recommendations:  (Repeat MBS in 2-3 weeks) Plan: All goals met    GO     [place] Siren M.Ed [employer] [phone]  05/16/2013

## 2013-05-16 NOTE — Progress Notes (Signed)
Patient discharged home with wife.  Discharge and medication instructions given to patient and wife.  Wife signed paperwork per patient's request.  No questions or concerns voiced.

## 2013-05-19 LAB — CULTURE, BLOOD (ROUTINE X 2)
CULTURE: NO GROWTH
CULTURE: NO GROWTH

## 2014-03-20 ENCOUNTER — Encounter (HOSPITAL_COMMUNITY): Payer: Self-pay | Admitting: Neurosurgery

## 2014-10-04 IMAGING — CT CT NECK W/ CM
4 of 8 series · 11 of 33 positions shown, 12 images · IV contrast (CONTRAST)
Comparison: None.

CLINICAL DATA: Dysphagia, fever status post cervical discectomy and
fusion.

EXAM:
CT NECK WITH CONTRAST
TECHNIQUE: Multidetector CT imaging of the neck was performed using the
standard protocol following the bolus administration of intravenous
contrast.
CONTRAST:  75mL OMNIPAQUE IOHEXOL 300 MG/ML  SOLN

[Series 4: soft tissue · axial · 0.54mm/px · z∈[+221,+393]mm · 3 of 172 slices shown]
[im 43/172  soft-tissue]
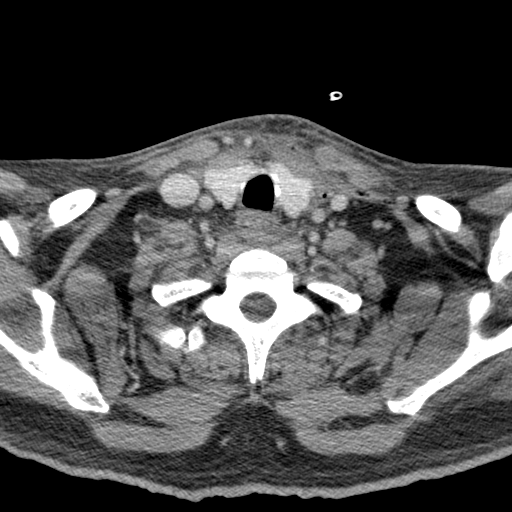
[im 86/172  soft-tissue]
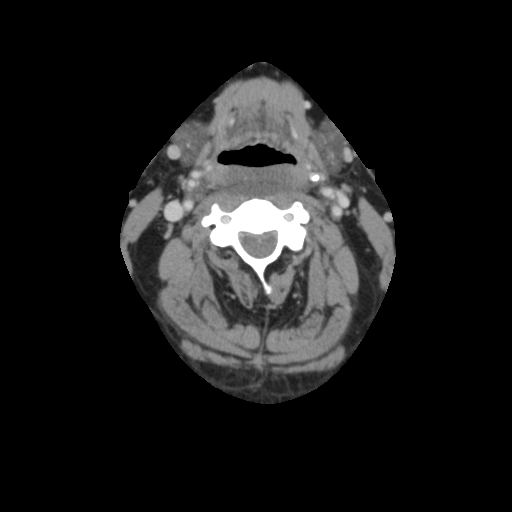
[im 129/172  soft-tissue]
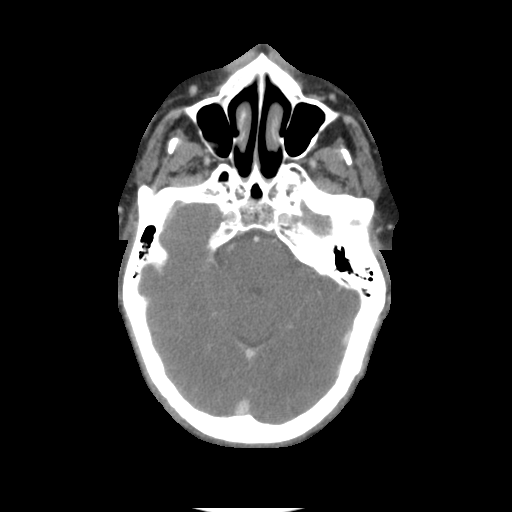

[Series 8040: coronal bone · coronal · 0.67mm/px · 1 of 87 slices shown]
[im 44/87  bone]
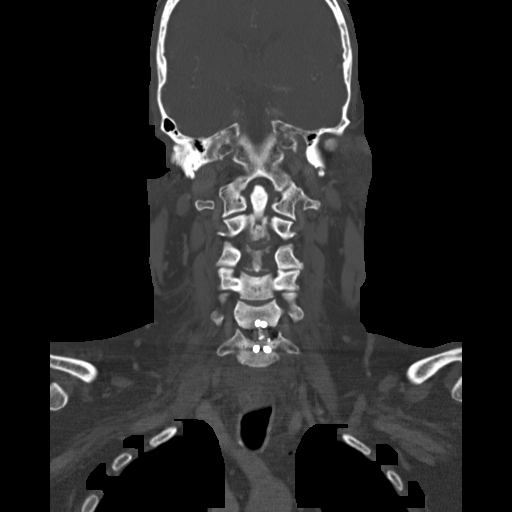

[mpr, sagittal, sagittal · sagittal · 0.67mm/px · 5 of 84 slices shown]
[im 14/84  bone]
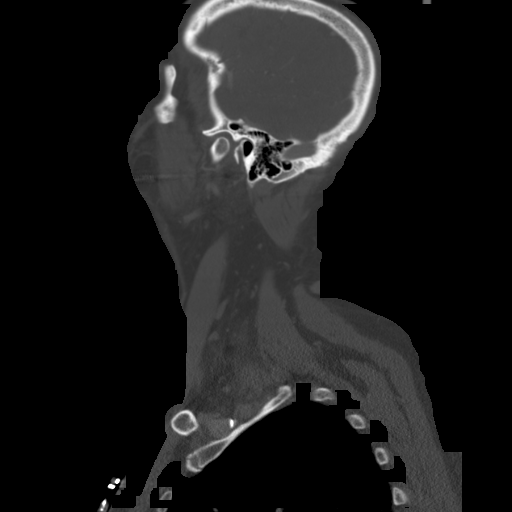
[im 28/84  bone]
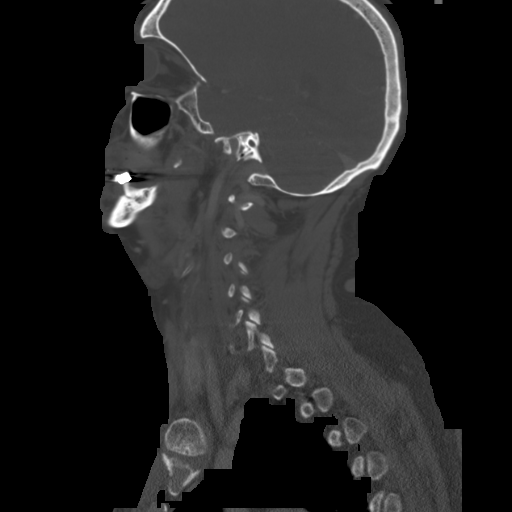
[im 42/84  bone]
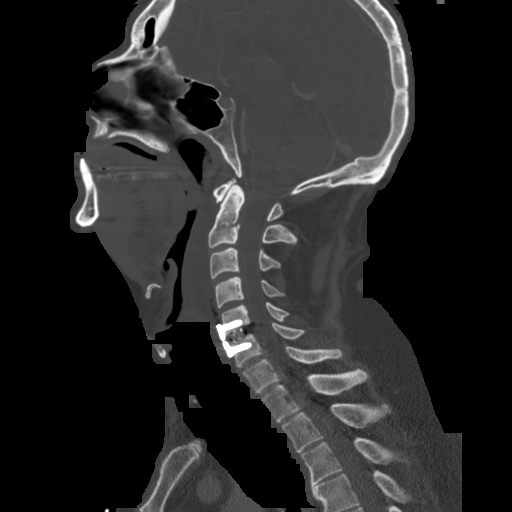
[im 56/84  bone]
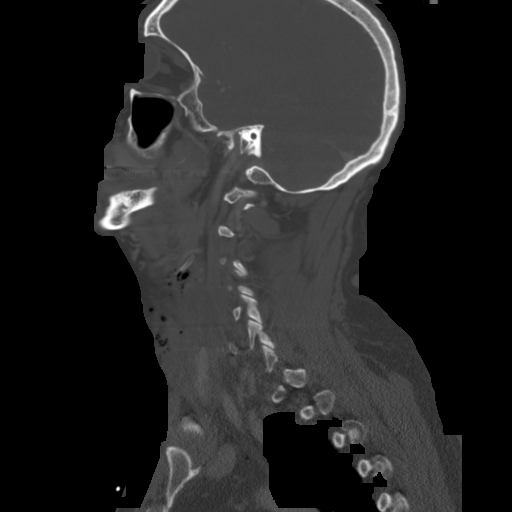
[im 70/84  bone]
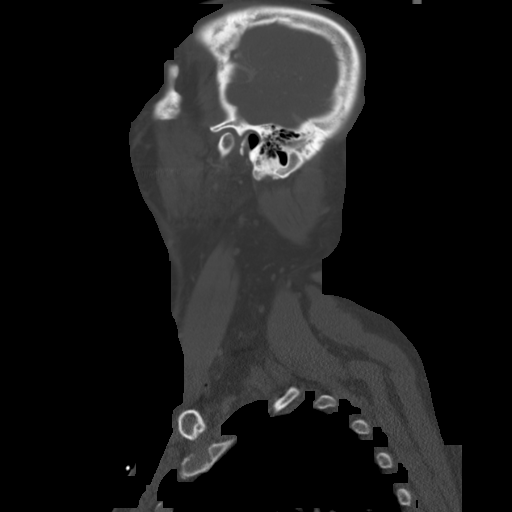

[mpr, orthogonal · axial · 0.54mm/px · z∈[+267,+347]mm · 2 of 121 slices shown, 3 images]
[im 41/121  soft-tissue]
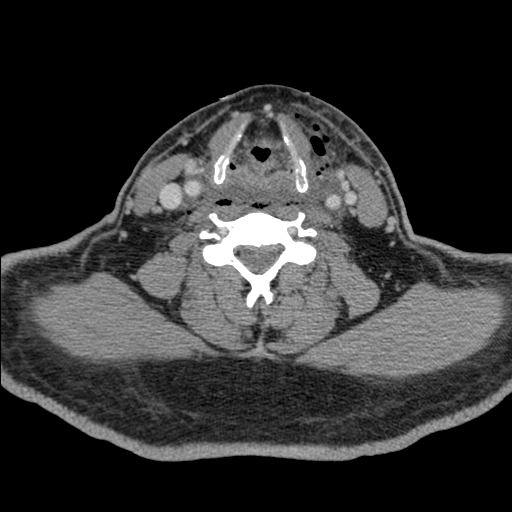
[im 41/121  bone]
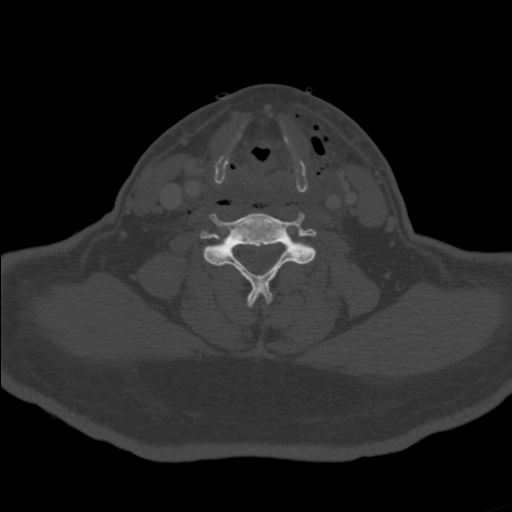
[im 81/121  bone]
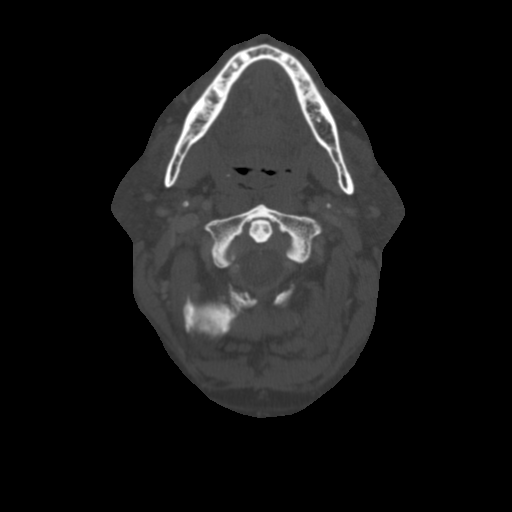

[11 of 33 positions shown; findings below may reference images not displayed]

FINDINGS: Status post surgical anterior fusion of C5-6. Intervening spacer
device is noted. Visualized portions of upper lung fields appear
normal. Epiglottis appears normal. Parotid glands appear normal. No
significant adenopathy is noted soft tissue gas is seen in the
anterior portion the neck as well as in the prevertebral space
consistent with the patient's postoperative status. There is seen
fluid in the prevertebral space that demonstrates in maximum
thickness of approximately 12 mm. This may simply represent
postoperative fluid, but the possibility of retropharyngeal abscess
cannot be excluded. This does cause some displacement of the
hypopharyngeal airway. Vascular structures appear to be intact the
visualized portion of the trachea appears normal. Thyroid gland
appears normal. Visualized portions of paranasal sinuses appear
normal.
IMPRESSION: Status post surgical anterior fusion of C5-6. Soft tissue gas is
seen in the soft tissues of the neck in this area which is an
expected postoperative finding. Soft tissue gas is also seen in the
prevertebral space which is expected postoperative finding. Fluid
collection is seen in the prevertebral soft tissues space above the
surgical site which does result and displacement of the
hypopharyngeal airway and measures 12 mm in maximum thickness. While
this may simply represent postoperative fluid, abscess cannot be
excluded. These results were discussed with the referring physician.
